# Patient Record
Sex: Female | Born: 1975 | Race: White | Hispanic: No | Marital: Married | Smoking: Never smoker
Health system: Southern US, Community
[De-identification: ages and names within clinical notes are randomized; demographics above are authoritative.]

## PROBLEM LIST (undated history)

## (undated) DIAGNOSIS — O99892 Other specified diseases and conditions complicating childbirth: Secondary | ICD-10-CM

---

## 2000-02-26 ENCOUNTER — Other Ambulatory Visit: Admission: RE | Admit: 2000-02-26 | Discharge: 2000-02-26 | Payer: Self-pay | Admitting: *Deleted

## 2000-05-28 ENCOUNTER — Encounter: Admission: RE | Admit: 2000-05-28 | Discharge: 2000-05-28 | Payer: Self-pay | Admitting: *Deleted

## 2000-05-28 ENCOUNTER — Encounter: Payer: Self-pay | Admitting: *Deleted

## 2001-03-03 ENCOUNTER — Other Ambulatory Visit: Admission: RE | Admit: 2001-03-03 | Discharge: 2001-03-03 | Payer: Self-pay | Admitting: Gynecology

## 2002-03-19 ENCOUNTER — Other Ambulatory Visit: Admission: RE | Admit: 2002-03-19 | Discharge: 2002-03-19 | Payer: Self-pay | Admitting: Gynecology

## 2002-10-14 ENCOUNTER — Other Ambulatory Visit: Admission: RE | Admit: 2002-10-14 | Discharge: 2002-10-14 | Payer: Self-pay | Admitting: Gynecology

## 2003-04-28 ENCOUNTER — Inpatient Hospital Stay (HOSPITAL_COMMUNITY): Admission: AD | Admit: 2003-04-28 | Discharge: 2003-04-28 | Payer: Self-pay | Admitting: Gynecology

## 2003-05-09 ENCOUNTER — Inpatient Hospital Stay (HOSPITAL_COMMUNITY): Admission: AD | Admit: 2003-05-09 | Discharge: 2003-05-13 | Payer: Self-pay | Admitting: Gynecology

## 2003-05-10 ENCOUNTER — Encounter (INDEPENDENT_AMBULATORY_CARE_PROVIDER_SITE_OTHER): Payer: Self-pay

## 2003-06-28 ENCOUNTER — Other Ambulatory Visit: Admission: RE | Admit: 2003-06-28 | Discharge: 2003-06-28 | Payer: Self-pay | Admitting: Gynecology

## 2004-07-31 ENCOUNTER — Other Ambulatory Visit: Admission: RE | Admit: 2004-07-31 | Discharge: 2004-07-31 | Payer: Self-pay | Admitting: Gynecology

## 2005-01-31 ENCOUNTER — Ambulatory Visit: Payer: Self-pay | Admitting: Oncology

## 2005-02-13 ENCOUNTER — Encounter: Admission: RE | Admit: 2005-02-13 | Discharge: 2005-02-13 | Payer: Self-pay | Admitting: Obstetrics and Gynecology

## 2005-03-27 ENCOUNTER — Ambulatory Visit: Payer: Self-pay | Admitting: Oncology

## 2005-10-10 ENCOUNTER — Other Ambulatory Visit: Admission: RE | Admit: 2005-10-10 | Discharge: 2005-10-10 | Payer: Self-pay | Admitting: Obstetrics and Gynecology

## 2006-04-09 ENCOUNTER — Ambulatory Visit: Payer: Self-pay | Admitting: Oncology

## 2006-04-15 LAB — CARDIOLIPIN ANTIBODIES, IGG, IGM, IGA: Anticardiolipin IgA: <7 [APL'U] (ref <13)

## 2006-04-15 LAB — LUPUS ANTICOAGULANT PANEL
DRVVT: 44.6 secs — ABNORMAL HIGH (ref 31.9–44.2)
PTT Lupus Anticoagulant: 57 secs — ABNORMAL HIGH (ref 36.3–48.8)
PTTLA 4:1 Mix: 45.1 secs (ref 36.3–48.8)

## 2006-12-11 ENCOUNTER — Inpatient Hospital Stay (HOSPITAL_COMMUNITY): Admission: AD | Admit: 2006-12-11 | Discharge: 2006-12-14 | Payer: Self-pay | Admitting: Obstetrics and Gynecology

## 2006-12-12 ENCOUNTER — Encounter (INDEPENDENT_AMBULATORY_CARE_PROVIDER_SITE_OTHER): Payer: Self-pay | Admitting: Obstetrics and Gynecology

## 2009-04-27 ENCOUNTER — Inpatient Hospital Stay (HOSPITAL_COMMUNITY): Admission: AD | Admit: 2009-04-27 | Discharge: 2009-04-29 | Payer: Self-pay | Admitting: Obstetrics and Gynecology

## 2009-06-08 ENCOUNTER — Inpatient Hospital Stay (HOSPITAL_COMMUNITY): Admission: RE | Admit: 2009-06-08 | Discharge: 2009-06-12 | Payer: Self-pay | Admitting: Obstetrics and Gynecology

## 2010-09-24 LAB — ABO/RH: ABO/RH(D): A POS

## 2010-09-24 LAB — CBC
HCT: 32.7 % — ABNORMAL LOW (ref 36.0–46.0)
HCT: 36 % (ref 36.0–46.0)
Hemoglobin: 11.2 g/dL — ABNORMAL LOW (ref 12.0–15.0)
Hemoglobin: 12.3 g/dL (ref 12.0–15.0)
MCHC: 34 g/dL (ref 30.0–36.0)
MCV: 91.1 fL (ref 78.0–100.0)
MCV: 91.6 fL (ref 78.0–100.0)
Platelets: 229 10*3/uL (ref 150–400)
RBC: 3.95 MIL/uL (ref 3.87–5.11)
RDW: 13.3 % (ref 11.5–15.5)
WBC: 11.6 10*3/uL — ABNORMAL HIGH (ref 4.0–10.5)
WBC: 11.7 10*3/uL — ABNORMAL HIGH (ref 4.0–10.5)

## 2010-09-24 LAB — TYPE AND SCREEN
ABO/RH(D): A POS
Antibody Screen: NEGATIVE

## 2010-09-24 LAB — RPR: RPR Ser Ql: NONREACTIVE

## 2010-09-26 LAB — CBC
HCT: 32.2 % — ABNORMAL LOW (ref 36.0–46.0)
Hemoglobin: 11.1 g/dL — ABNORMAL LOW (ref 12.0–15.0)
MCHC: 34.3 g/dL (ref 30.0–36.0)
RDW: 12.7 % (ref 11.5–15.5)

## 2010-11-06 NOTE — Op Note (Signed)
[name], [name]                ACCOUNT NO.:  [id number]   MEDICAL RECORD NO.:  [id number]          PATIENT TYPE:  INP   LOCATION:  9171                          FACILITY:  [facility]   PHYSICIAN:  Michelle L. Grewal, M.D.DATE OF BIRTH:  Dec 07, 1975   DATE OF PROCEDURE:  12/12/2006  DATE OF DISCHARGE:                               OPERATIVE REPORT   PREOPERATIVE DIAGNOSES:  1. Intrauterine pregnancy at term.  2. Failed vaginal birth after Cesarean section.  3. Failure to progress.   POSTOPERATIVE DIAGNOSES:  1. Intrauterine pregnancy at term.  2. Failed vaginal birth after Cesarean section.  3. Failure to progress.   PROCEDURE:  Repeat low transverse cesarean section and extensive lysis  of adhesions.   SURGEON:  Dr. Vincente Poli.   ANESTHESIA:  Is epidural.   SPECIMENS:  Female infant.  Apgars of 9 at one minute and 9 at five  minutes.   ESTIMATED BLOOD LOSS:  500 mL.   DRAINS:  Foley, draining clear urine.   PATHOLOGY:  None.   COMPLICATIONS:  None.   PROCEDURE:  Patient taken to the operating room.  Her epidural was dosed  and found to be adequate.  She was then prepped and draped.  A low  transverse incision was made, carried down to the fascia, the fascia  scored in the midline and extended laterally.  The rectus muscles were  separated in the midline.  Upon entry into the peritoneal cavity, the  uterus was noted to be densely adherent to the anterior abdominal wall.  I was able to find a window and was able to take down some of the  adhesions.  We were then able to develop a bladder flap.  The lower  uterine segment was extremely thin and bulging.  A low transverse  incision was made in the uterus.  Uterus was entered using a hemostat.  Amniotic fluid was clear.  The baby was in direct OP position and was  delivered without any difficulty.  The head was still quite high.  The  cord was clamped and cut.  The baby was a female infant with Apgars of 9  at one minute and 9 at  five minutes.  The placenta was manually removed  and noted to be normal intact with three-vessel cord.  At this point,  antibiotics and Pitocin were given.  We then closed the uterine incision  in 1 layer using 0 chromic in continuous running locked stitch.  Hemostasis was excellent.  I was able to take down some of the  adhesions, but some of the adhesions were densely adherent to the fundus  of the uterus, and I was concerned that if adhesions were taken down,  then there might be extensive bleeding given that the vascular nature of  the uterus.  I then reapproximated the rectus muscles together using 0  Vicryl and then closed the fascia using 0 Vicryl in a  running stitch starting at each corner and meeting in the midline.  After irrigation of the subcutaneous layer, the skin was closed with 3-0  Vicryl subcuticular stitch, and the  skin was sealed with Dermabond skin  adhesive.  All sponge, lap and instrument counts were correct x2.  The  patient went to the recovery room in stable condition.      Michelle L. Vincente Poli, M.D.  Electronically Signed     MLG/MEDQ  D:  12/12/2006  T:  12/13/2006  Job:  [id number]

## 2010-11-09 NOTE — H&P (Signed)
NAMEARIELL, GUNNELS                            ACCOUNT NO.:  [id number]   MEDICAL RECORD NO.:  [id number]                   PATIENT TYPE:   LOCATION:                                       [facility]ILITY:  [facility]   PHYSICIAN:  Timothy P. Fontaine, M.D.           DATE OF BIRTH:  1976/06/08   DATE OF ADMISSION:  05/09/2003  DATE OF DISCHARGE:                                HISTORY & PHYSICAL   CHIEF COMPLAINT:  1. Pregnancy at 38-1/2 weeks.  2. Antiphospholipid syndrome on anticoagulation.  3. Lovenox 40 mg daily.  4. Family history of hemophilia.  5. Large for gestational age baby.   HISTORY OF PRESENT ILLNESS:  A 73 year old, G1, P0 female with a history of  antiphospholipid syndrome.  Begun on Lovenox 40 mg per day.  The patient is  admitted now for induction due to a history of being on Lovenox to allow  discontinuation and regional anesthesia.  The obstetrical history is also  significant for a family history of hemophilia, fetal status unknown, with  plans on holding on circumcision after birth to allow fetal investigation  first.  The prenatal course has otherwise been uncomplicated.  She has been  undergoing antepartum testing with reassuring NSTs and amniotic fluid index.  For the remainder of her history, see her [place] on exam.   PHYSICAL EXAMINATION:  HEENT:  Normal.  LUNGS:  Clear.  CARDIAC:  Regular rhythm.  No murmurs, rubs, or gallops.  ABDOMEN:  Gravid.  Vertex fetus appropriate for term.  PELVIC:  The cervix is closed and long posterior.   ASSESSMENT AND PLAN:  A 35 year old G1 with antiphospholipid syndrome on  Lovenox for serial induction to allow discontinuation of Lovenox and  regional anesthesia.  Will plan on Cervidil the evening of admission and  Pitocin the following day.  Will hold Lovenox 24 hours prior to admission.                                               Timothy P. Audie Box, M.D.    TPF/MEDQ  D:  05/09/2003  T:  05/09/2003  Job:  [id number]

## 2010-11-09 NOTE — Discharge Summary (Signed)
[name], [name]                            ACCOUNT NO.:  [id number]   MEDICAL RECORD NO.:  [id number]                   PATIENT TYPE:  INP   LOCATION:  9124                                 [facility]ACILITY:  [facility]   PHYSICIAN:  Ivor Costa. Farrel Gobble, M.D.              DATE OF BIRTH:  03-Jan-1976   DATE OF ADMISSION:  05/09/2003  DATE OF DISCHARGE:  05/13/2003                                 DISCHARGE SUMMARY   DISCHARGE DIAGNOSES:  1. Intrauterine pregnancy 38-1/2 weeks, delivered.  2. Antiphospholipid syndrome on anticoagulation, Lovenox.  3. Family history of hemophilia.  4. Suspected large for gestational age baby.  5. Chorioamnionitis.  6. Status post primary low transverse cesarean section by Timothy P.     Fontaine, M.D. on May 10, 2003.   HISTORY:  This is a 35 year old female gravida 1, para 0 with an EDC of  May 20, 2003.  Prenatal course was complicated by antiphospholipid  syndrome.  Therefore, was on Lovenox 40 mg daily.  Also, suspected  macrosomia.  The patient has a family history of hemophilia.  Fetal status  unknown.  Therefore, plans were to hold on circumcision until after birth  until fetal investigation was done.   HOSPITAL COURSE:  On May 09, 2003 patient is admitted at 38-1/2 weeks  with __________ antiphospholipid syndrome, suspected large for gestational  age baby.  The patient was begun on Pitocin.  Artificial rupture of  membranes performed.  However, patient subsequently developed  chorioamnionitis.  Therefore, underwent a primary low transverse cesarean  section by Timothy P. Fontaine, M.D.  Underwent delivery of a female, Apgars  of 8 and 9, weight of 8 pounds 5 ounces.  There were no complications.  Specimens were taken for routine cord blood __________, PT/PTT for factor  VIII, factor IX cord blood studies as well.  Postoperative day one patient  did have a Tmax of 103 that a.m., however, was afebrile on May 12, 2003.  The patient was  afebrile.  Vital signs stable.  Doing well.  May 13, 2003 patient had been afebrile since that first elevation and  antibiotics were discontinued 8 p.m. of May 12, 2003.  The patient was  thought in satisfactory condition for discharge and was discharged home and  given [facility] postpartum instruction.   ACCESSORY CLINICAL FINDINGS:  Laboratories:  The patient is A+.  Rubella  immune.  On May 11, 2003 hemoglobin 10.1.   DISPOSITION:  The patient is discharged to home.  She was to be on  postpartum baby aspirin.  She was to return to triage for circumcision which  was cleared by pediatricians.  Given prescription for Tylox p.r.n. pain #30.     Susa Loffler, P.A.                    Ivor Costa. Farrel Gobble, M.D.    TSG/MEDQ  D:  06/06/2003  T:  06/06/2003  Job:  [id number]

## 2010-11-09 NOTE — Discharge Summary (Signed)
[name], [name]                ACCOUNT NO.:  [id number]   MEDICAL RECORD NO.:  [id number]          PATIENT TYPE:  INP   LOCATION:  9137                          [facility]ACILITY:  [facility]   PHYSICIAN:  Michelle L. Grewal, M.D.DATE OF BIRTH:  Oct 25, 1975   DATE OF ADMISSION:  12/11/2006  DATE OF DISCHARGE:  12/14/2006                               DISCHARGE SUMMARY   ADMISSION DIAGNOSES:  1. Intrauterine pregnancy at 39-6/7 weeks estimated gestational age.  2. Previous cesarean section, desires vaginal birth after cesarean      section.   DISCHARGE DIAGNOSIS:  Status post low transverse cesarean section  secondary to failure to progress with a viable female infant.   PROCEDURE:  Repeat low transverse cesarean section.   REASON FOR ADMISSION:  Please see written H&P.   HOSPITAL COURSE:  The patient is 35 year old gravida 2, para 1-0-0-1  that was presented to [facility] for induction of labor  secondary to desires VBAC delivery.  The patient had had a previous  cesarean section.  On admission vital signs were stable.  Fetal heart  tones were reassuring.  The patient was administered Cervidil with plan  to augment her labor with Pitocin on the following morning.  On the  following morning cervix was dilated to 1 cm, vertex at minus two  station, 90% effaced.  Artificial rupture of membranes was performed  revealing clear fluid.  Pitocin was started and epidural was given for  the patient's comfort.  Later that evening the patient had been in good  labor pattern all day and uterine pressure catheter had been placed  which had revealed adequate labor.  Cervix was reexamined and noted to  be 6 cm dilated with caput.  Further change had been observed to the  last 4 hours.  Decision was made to proceed with a repeat low transverse  cesarean section.  The patient was then transferred to operating room  where epidural was dosed to an adequate surgical level.  Low transverse  incision  was made with delivery of a viable female infant weighing 8  pounds 6 ounces Apgars of 9 at 1 and 9 at 5 minutes.  The patient  tolerated procedure well and taken to the recovery room in stable  condition.  On postoperative day #1 the patient was without complaint.  Vital signs were stable.  She is afebrile.  Abdomen soft.  Uterus was  nontender.  Incision was clean, dry and intact. Laboratory findings  revealed hemoglobin of 10.6, platelet count of 205,000, WBC count of  13.7. On postoperative day #2 the patient did desire early discharge.  Vital signs were stable.  She is afebrile.  Abdomen soft.  Fundus firm  and nontender.  Incision was clean, dry and intact.  Discharge  instructions were reviewed and the patient was later discharged home.   CONDITION ON DISCHARGE:  Stable, diet regular as tolerated.   ACTIVITY:  No heavy lifting, no driving x2 weeks, no vaginal entry.   FOLLOW UP:  Patient follow up in the office in 1 week for an incision  check.  She is to call for temperature greater than 100 degrees,  persistent nausea, vomiting, heavy vaginal bleeding and/or redness or  drainage from incisional site.   DISCHARGE MEDICATIONS:  1. Darvocet N 100 #30 one every 4-6 hours.  2. Motrin 600 mg every 6 hours.  3. Prenatal vitamins one p.o. daily.  4. Colace one p.o. daily p.r.n.      Julio Sicks, N.P.      Stann Mainland. Vincente Poli, M.D.  Electronically Signed    CC/MEDQ  D:  01/13/2007  T:  01/13/2007  Job:  [id number]

## 2010-11-09 NOTE — Op Note (Signed)
[name], [name]                            ACCOUNT NO.:  [id number]   MEDICAL RECORD NO.:  [id number]                   PATIENT TYPE:  INP   LOCATION:  9124                                 [facility]   PHYSICIAN:  Timothy P. Fontaine, M.D.           DATE OF BIRTH:  [birth year]   DATE OF PROCEDURE:  05/10/2003  DATE OF DISCHARGE:                                 OPERATIVE REPORT   PREOPERATIVE DIAGNOSES:  1. Failure to progress.  2. Term pregnancy.  3. Antiphospholipid syndrome.  4. Chorioamnionitis.   POSTOPERATIVE DIAGNOSES:  1. Failure to progress.  2. Term pregnancy.  3. Antiphospholipid syndrome.  4. Chorioamnionitis.   PROCEDURE:  Primary low transverse cervical cesarean section.   SURGEON:  Timothy P. Fontaine, M.D.   ASSISTANT:  Scrub technician.   ANESTHESIA:  Epidural.   ESTIMATED BLOOD LOSS:  500 mL.   COMPLICATIONS:  None.   SPECIMENS:  1. Placenta.  2. Routine cord blood and studies.  3. PT/PTT factor VIII, factor IX cord blood studies.   FINDINGS:  Normal female infant, Apgars 8 and 9, nuchal cord x2 noted.  Weight  and time pending.  Pelvic anatomy noted to be normal.  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   DESCRIPTION OF PROCEDURE:  The patient was taken to the operating room,  underwent epidural dosing of her catheter.  Placed in left tilt supine  position, received an abdominal preparation with Betadine solution, draped  in usual fashion, having had a Foley catheter previously placed.  After  assuring adequate anesthesia, the abdomen was sharply entered through a  Pfannenstiel incision achieving adequate hemostasis at all levels.  The  bladder flap was sharply and bluntly developed without difficulty and the  uterus was sharply entered in the lower uterine segment and bluntly extended  laterally.  The membranes were ruptured.  The fluid noted to be clear.  The  infant's head delivered through  the incision.  Nares and mouth suctioned.  Double nuchal cord reduced.  The rest of the infant delivered.  The cord  doubly clamped and cut and the infant handed to pediatrics in attendance.  Samples of cord blood were obtained for routine studies as well as for  coagulation studies.  The placenta was spontaneously extracted and noted to  be intact.  The patient received 1 g of Ancef prophylaxis at this time.  Uterus was exteriorized.  The endometrial cavity explored with a sponge to  remove all placental and membrane fragments.  The uterine incision was  closed in one layer using 0 Vicryl suture in a running interlocking stitch.  The uterus was then returned to the abdomen which was copiously irrigated.  Adequate hemostasis visualized.  The anterior fascia was reapproximated  using 0 Vicryl suture in a running stitch.  The subcutaneous tissues were  irrigated.  Adequate hemostasis achieved with electrocautery.  The skin  reapproximated using a running subcuticular 4-0 Vicryl stitch.  Steri-Strips  with Benzoin applied.  Pressure dressing applied.  The patient was taken to  the recovery room in good condition having tolerated the procedure well.                                               Timothy P. Audie Box, M.D.    TPF/MEDQ  D:  05/10/2003  T:  05/11/2003  Job:  [id number]

## 2011-04-10 LAB — CBC
HCT: 35.7 — ABNORMAL LOW
Hemoglobin: 10.6 — ABNORMAL LOW
MCHC: 34.5
MCHC: 34.7
MCV: 87.8
Platelets: 251
RDW: 13.7
RDW: 14.1 — ABNORMAL HIGH
WBC: 8.7

## 2011-06-14 ENCOUNTER — Emergency Department (HOSPITAL_COMMUNITY)
Admission: EM | Admit: 2011-06-14 | Discharge: 2011-06-14 | Disposition: A | Discharge status: Home / Self Care | Payer: Self-pay | Source: Home / Self Care

## 2011-06-14 DIAGNOSIS — J019 Acute sinusitis, unspecified: Secondary | ICD-10-CM

## 2011-06-14 DIAGNOSIS — H109 Unspecified conjunctivitis: Secondary | ICD-10-CM

## 2011-06-14 HISTORY — DX: Other specified diseases and conditions complicating childbirth: O99.892

## 2011-06-14 MED ORDER — CIPROFLOXACIN HCL 0.3 % OP SOLN: 2.0000 [drp] | Freq: Three times a day (TID) | OPHTHALMIC | Status: AC

## 2011-06-14 MED ORDER — AMOXICILLIN-POT CLAVULANATE 875-125 MG PO TABS: 1.0000 | ORAL_TABLET | Freq: Two times a day (BID) | ORAL | Status: AC

## 2011-06-14 NOTE — ED Provider Notes (Signed)
History     CSN: [id number]  Arrival date & time 06/14/11  1927   None     Chief Complaint  Patient presents with  . Nasal Congestion    (Consider location/radiation/quality/duration/timing/severity/associated sxs/prior treatment) HPI Comments: Pt c/o head congestion, sinus pressure and green nasal mucus x 1 month. Also has a mild cough, but no chest congestion. No ear pain or sore throat. Taking Sudafed and Motrin with some symptom relief. Awoke this morning with crusting of bilat eyes. No mucus or irritation since awoke. Is concerned may be getting pink eye as son recently had pink eye infection.   No past medical history on file.  No past surgical history on file.  No family history on file.  History  Substance Use Topics  . Smoking status: Not on file  . Smokeless tobacco: Not on file  . Alcohol Use: Not on file    OB History    No data available      Review of Systems  Constitutional: Negative for fever and chills.  HENT: Positive for congestion, rhinorrhea, postnasal drip and sinus pressure. Negative for ear pain and sore throat.   Eyes: Negative for pain, discharge, redness and itching.  Respiratory: Positive for cough. Negative for shortness of breath.   Cardiovascular: Negative for chest pain.    Allergies  Review of patient's allergies indicates not on file.  Home Medications  No current outpatient prescriptions on file.  BP 114/62  Pulse 96  Temp(Src) 98.2 F (36.8 C) (Oral)  Resp 20  SpO2 96%  Physical Exam  Nursing note and vitals reviewed. Constitutional: She appears well-developed and well-nourished. No distress.  HENT:  Head: Normocephalic and atraumatic.  Right Ear: Tympanic membrane, external ear and ear canal normal.  Left Ear: Tympanic membrane, external ear and ear canal normal.  Nose: Mucosal edema and rhinorrhea (thick yellow/green mucus Lt nostril) present. Right sinus exhibits maxillary sinus tenderness and frontal sinus  tenderness. Left sinus exhibits maxillary sinus tenderness and frontal sinus tenderness.  Mouth/Throat: Uvula is midline, oropharynx is clear and moist and mucous membranes are normal. No oropharyngeal exudate, posterior oropharyngeal edema or posterior oropharyngeal erythema.  Eyes: Conjunctivae are normal. Pupils are equal, round, and reactive to light.  Neck: Neck supple.  Cardiovascular: Normal rate, regular rhythm and normal heart sounds.   Pulmonary/Chest: Effort normal and breath sounds normal. No respiratory distress.  Lymphadenopathy:    She has no cervical adenopathy.  Neurological: She is alert.  Skin: Skin is warm and dry.  Psychiatric: She has a normal mood and affect.    ED Course  Procedures (including critical care time)  Labs Reviewed - No data to display No results found.   No diagnosis found.    MDM  Sinus congestion symptoms x 4 weeks. Possible early conjunctivitis with normal eye exam.        Melody Comas, PA 06/14/11 2027

## 2011-06-14 NOTE — ED Notes (Signed)
C/o eyes matted, congestion, HA

## 2011-06-15 NOTE — ED Provider Notes (Signed)
Medical screening examination/treatment/procedure(s) were performed by 3rd year family medicine resident practitioner and as supervising physician I was immediately available for consultation/collaboration.   [facility]; MD   Sharin Grave, MD 06/15/11 [phone]

## 2012-04-01 ENCOUNTER — Emergency Department (HOSPITAL_COMMUNITY)
Admission: EM | Admit: 2012-04-01 | Discharge: 2012-04-01 | Disposition: A | Discharge status: Home / Self Care | Payer: 59 | Source: Home / Self Care

## 2012-04-01 ENCOUNTER — Encounter (HOSPITAL_COMMUNITY): Payer: Self-pay | Admitting: *Deleted

## 2012-04-01 DIAGNOSIS — L03019 Cellulitis of unspecified finger: Secondary | ICD-10-CM

## 2012-04-01 DIAGNOSIS — L03011 Cellulitis of right finger: Secondary | ICD-10-CM

## 2012-04-01 MED ORDER — CEPHALEXIN 500 MG PO CAPS: 500.0000 mg | ORAL_CAPSULE | Freq: Four times a day (QID) | ORAL | Status: DC

## 2012-04-01 NOTE — ED Provider Notes (Signed)
History     CSN: [id number]  Arrival date & time 04/01/12  1936   None     No chief complaint on file.   (Consider location/radiation/quality/duration/timing/severity/associated sxs/prior treatment) HPI Comments: 36 year old female with complaints of right fifth finger pain for 10 days. She is developed erythema distal to the lunula with mild swelling adjacent to the nail. She is uncertain as to what may have caused the sons. She denies any known trauma. She has been soaking it in warm water and has on several occasions seen some purulent drainage.   Past Medical History  Diagnosis Date  . Delivery By Emergency Caesarean Section     No past surgical history on file.  No family history on file.  History  Substance Use Topics  . Smoking status: Not on file  . Smokeless tobacco: Not on file  . Alcohol Use:     OB History    Grav Para Term Preterm Abortions TAB SAB Ect Mult Living                  Review of Systems  Constitutional: Negative for fever, chills and activity change.  HENT: Negative.   Respiratory: Negative.   Cardiovascular: Negative.   Musculoskeletal: Negative for joint swelling and arthralgias.  Skin: Negative for color change, pallor and rash.  Neurological: Negative.     Allergies  Sulfa antibiotics  Home Medications   Current Outpatient Rx  Name Route Sig Dispense Refill  . BUPROPION HCL ER (XL) 300 MG PO TB24 Oral Take 300 mg by mouth daily.      . CEPHALEXIN 500 MG PO CAPS Oral Take 1 capsule (500 mg total) by mouth 4 (four) times daily. 32 capsule 0  . CETIRIZINE HCL 10 MG PO TABS Oral Take 10 mg by mouth daily.      . IBUPROFEN 600 MG PO TABS Oral Take 600 mg by mouth every 6 (six) hours as needed.        BP 100/48  Pulse 66  Temp 98.2 F (36.8 C) (Oral)  Resp 16  SpO2 100%  Physical Exam  Constitutional: She is oriented to person, place, and time. She appears well-developed and well-nourished. No distress.  Pulmonary/Chest:  Effort normal.  Musculoskeletal: Normal range of motion. She exhibits tenderness. She exhibits no edema.  Neurological: She is alert and oriented to person, place, and time. No cranial nerve deficit.  Skin: Skin is warm and dry. No rash noted.       Puffy erythema just proximal to the lunula. No fluctuance or appearance of purulence within the area of erythema. There is no felon or pulp involvement.  Psychiatric: She has a normal mood and affect.    ED Course  Procedures (including critical care time)  Labs Reviewed - No data to display No results found.   1. Paronychia of fifth finger, right       MDM  Keflex 500 mg 4 times a day for 8 days. Continue soaking in warm water. In the sons accumulation of purulence that has not drained spontaneously spreading of erythema or other worsening return to the urgent care.        Hayden Rasmussen, NP 04/01/12 2106

## 2012-04-01 NOTE — ED Provider Notes (Signed)
Medical screening examination/treatment/procedure(s) were performed by non-physician practitioner and as supervising physician I was immediately available for consultation/collaboration.  Shandy Vi, M.D.   Angalena Cousineau C Zay Yeargan, MD 04/01/12 2251 

## 2012-04-01 NOTE — ED Notes (Signed)
Paronychia to R small finger onset 9/30.  Has been using warm compresses and bacitracin oint.

## 2013-09-01 ENCOUNTER — Emergency Department (HOSPITAL_COMMUNITY)
Admission: EM | Admit: 2013-09-01 | Discharge: 2013-09-01 | Disposition: A | Discharge status: Home / Self Care | Payer: 59 | Source: Home / Self Care | Attending: Family Medicine | Admitting: Family Medicine

## 2013-09-01 ENCOUNTER — Encounter (HOSPITAL_COMMUNITY): Payer: Self-pay | Admitting: Emergency Medicine

## 2013-09-01 DIAGNOSIS — R7402 Elevation of levels of lactic acid dehydrogenase (LDH): Secondary | ICD-10-CM

## 2013-09-01 DIAGNOSIS — R109 Unspecified abdominal pain: Secondary | ICD-10-CM

## 2013-09-01 DIAGNOSIS — R740 Nonspecific elevation of levels of transaminase and lactic acid dehydrogenase [LDH]: Secondary | ICD-10-CM

## 2013-09-01 DIAGNOSIS — R7401 Elevation of levels of liver transaminase levels: Secondary | ICD-10-CM

## 2013-09-01 LAB — COMPREHENSIVE METABOLIC PANEL
ALK PHOS: 52 U/L (ref 39–117)
ALT: 66 U/L — ABNORMAL HIGH (ref 0–35)
AST: 41 U/L — ABNORMAL HIGH (ref 0–37)
Albumin: 4.1 g/dL (ref 3.5–5.2)
BILIRUBIN TOTAL: 0.3 mg/dL (ref 0.3–1.2)
BUN: 14 mg/dL (ref 6–23)
CHLORIDE: 100 meq/L (ref 96–112)
CO2: 27 mEq/L (ref 19–32)
CREATININE: 0.9 mg/dL (ref 0.50–1.10)
Calcium: 9.4 mg/dL (ref 8.4–10.5)
GFR, EST NON AFRICAN AMERICAN: 81 mL/min — AB (ref >90)
GLUCOSE: 85 mg/dL (ref 70–99)
POTASSIUM: 4.3 meq/L (ref 3.7–5.3)
Sodium: 141 mEq/L (ref 137–147)
Total Protein: 7.8 g/dL (ref 6.0–8.3)

## 2013-09-01 LAB — LIPASE, BLOOD: LIPASE: 36 U/L (ref 11–59)

## 2013-09-01 LAB — CBC WITH DIFFERENTIAL/PLATELET
BASOS PCT: 0 % (ref 0–1)
Basophils Absolute: 0 10*3/uL (ref 0.0–0.1)
Eosinophils Absolute: 0.3 10*3/uL (ref 0.0–0.7)
Eosinophils Relative: 3 % (ref 0–5)
HEMATOCRIT: 40.2 % (ref 36.0–46.0)
HEMOGLOBIN: 14.1 g/dL (ref 12.0–15.0)
LYMPHS ABS: 3.1 10*3/uL (ref 0.7–4.0)
Lymphocytes Relative: 39 % (ref 12–46)
MCH: 31.5 pg (ref 26.0–34.0)
MCHC: 35.1 g/dL (ref 30.0–36.0)
MCV: 89.7 fL (ref 78.0–100.0)
MONO ABS: 0.6 10*3/uL (ref 0.1–1.0)
MONOS PCT: 8 % (ref 3–12)
NEUTROS ABS: 4 10*3/uL (ref 1.7–7.7)
NEUTROS PCT: 50 % (ref 43–77)
Platelets: 280 10*3/uL (ref 150–400)
RBC: 4.48 MIL/uL (ref 3.87–5.11)
RDW: 12.2 % (ref 11.5–15.5)
WBC: 8 10*3/uL (ref 4.0–10.5)

## 2013-09-01 MED ORDER — GI COCKTAIL ~~LOC~~
30.0000 mL | Freq: Once | ORAL | Status: AC
  Administered 2013-09-01: 30 mL via ORAL

## 2013-09-01 MED ORDER — OMEPRAZOLE 40 MG PO CPDR: 40.0000 mg | DELAYED_RELEASE_CAPSULE | Freq: Every day | ORAL | Status: AC

## 2013-09-01 MED ORDER — SUCRALFATE 1 GM/10ML PO SUSP
1.0000 g | Freq: Three times a day (TID) | ORAL | Status: AC
Start: 2013-09-01 — End: ?

## 2013-09-01 MED ORDER — GI COCKTAIL ~~LOC~~
ORAL | Status: AC
  Filled 2013-09-01: qty 30

## 2013-09-01 NOTE — ED Notes (Signed)
Patient complains of abdominal pain in upper right quadrant; describes pain as dull and aching; states pain is worse when lying down and reaching forward; states pain started Sunday; with pain worsening in past two days.

## 2013-09-01 NOTE — Discharge Instructions (Signed)

## 2013-09-01 NOTE — ED Provider Notes (Signed)
CSN: [id number]     Arrival date & time 09/01/13  1926 History   First MD Initiated Contact with Patient 09/01/13 2014     No chief complaint on file.  (Consider location/radiation/quality/duration/timing/severity/associated sxs/prior Treatment) HPI Comments: 38 year old female presents for evaluation of abdominal pain. Since Sunday 4 days ago, she has intermittent right upper quadrant pain. The pain seems to be worse when she stretches out or lays back and is somewhat relieved by sitting upright. The pain comes and goes, and is very severe, 10 out of 10 in severity when it comes on. She is not having any pain at this time. The pain is not affected by eating or taking a deep breath. It is associated with some nausea. She has not had any vomiting. The pain does not radiate. She has no diarrhea. She has no history of similar symptoms. She has a possible history of antiphospholipid antibody syndrome, however she was seen by hematology and they told her they don't think she has it. She denies chest pain or shortness of breath. She has history of 3 C-sections, no other abdominal surgeries. No fever   Past Medical History  Diagnosis Date  . Delivery by emergency caesarean section    Past Surgical History  Procedure Laterality Date  . Cesarean section       x 3 '04, '08, '10   No family history on file. History  Substance Use Topics  . Smoking status: Never Smoker   . Smokeless tobacco: Not on file  . Alcohol Use: Yes     Comment: occasional   OB History   Grav Para Term Preterm Abortions TAB SAB Ect Mult Living                 Review of Systems  Constitutional: Negative for fever and chills.  Eyes: Negative for visual disturbance.  Respiratory: Negative for cough and shortness of breath.   Cardiovascular: Negative for chest pain, palpitations and leg swelling.  Gastrointestinal: Positive for nausea and abdominal pain. Negative for vomiting.  Endocrine: Negative for polydipsia and  polyuria.  Genitourinary: Negative for dysuria, urgency and frequency.  Musculoskeletal: Negative for arthralgias and myalgias.  Skin: Negative for rash.  Neurological: Negative for dizziness, weakness and light-headedness.    Allergies  Sulfa antibiotics  Home Medications   Current Outpatient Rx  Name  Route  Sig  Dispense  Refill  . buPROPion (WELLBUTRIN XL) 300 MG 24 hr tablet   Oral   Take 300 mg by mouth daily.           . cetirizine (ZYRTEC) 10 MG tablet   Oral   Take 10 mg by mouth daily.           . fluticasone (FLONASE) 50 MCG/ACT nasal spray   Each Nare   Place into both nostrils daily.         Marland Kitchen ibuprofen (ADVIL,MOTRIN) 600 MG tablet   Oral   Take 600 mg by mouth every 6 (six) hours as needed.           . Prenatal Vit-Fe Fumarate-FA (PRENATAL MULTIVITAMIN) TABS   Oral   Take 1 tablet by mouth daily.         . cephALEXin (KEFLEX) 500 MG capsule   Oral   Take 1 capsule (500 mg total) by mouth 4 (four) times daily.   32 capsule   0   . omeprazole (PRILOSEC) 40 MG capsule   Oral   Take 1 capsule (40 mg  total) by mouth daily.   30 capsule   0   . sucralfate (CARAFATE) 1 GM/10ML suspension   Oral   Take 10 mLs (1 g total) by mouth 4 (four) times daily -  with meals and at bedtime.   420 mL   0    BP 105/52  Pulse 67  Temp(Src) 98.4 F (36.9 C) (Oral)  Resp 20  SpO2 100%  LMP 08/24/2013 Physical Exam  Nursing note and vitals reviewed. Constitutional: She is oriented to person, place, and time. Vital signs are normal. She appears well-developed and well-nourished. No distress.  HENT:  Head: Normocephalic and atraumatic.  Eyes: Conjunctivae are normal. Right eye exhibits no discharge. Left eye exhibits no discharge.  Neck: Normal range of motion. Neck supple. No JVD present.  Cardiovascular: Normal rate, regular rhythm and normal heart sounds.  Exam reveals no gallop and no friction rub.   No murmur heard. Pulmonary/Chest: Effort normal  and breath sounds normal. No respiratory distress. She has no wheezes. She has no rales. She exhibits no tenderness.  Abdominal: Soft. Normal appearance and bowel sounds are normal. She exhibits no distension. There is no hepatosplenomegaly. There is tenderness (mild to moderate) in the right upper quadrant and epigastric area. There is no rigidity, no rebound, no guarding, no CVA tenderness, no tenderness at McBurney's point and negative Murphy's sign. No hernia.  Musculoskeletal: Normal range of motion.  Neurological: She is alert and oriented to person, place, and time. She has normal strength. She exhibits normal muscle tone. Coordination normal.  Skin: Skin is warm and dry. No rash noted. She is not diaphoretic.  Psychiatric: She has a normal mood and affect. Judgment and thought content normal.    ED Course  Procedures (including critical care time) Labs Review Labs Reviewed  COMPREHENSIVE METABOLIC PANEL - Abnormal; Notable for the following:    AST 41 (*)    ALT 66 (*)    GFR calc non Af Amer 81 (*)    All other components within normal limits  CBC WITH DIFFERENTIAL  LIPASE, BLOOD   Imaging Review No results found.  Some relief with GI cocktail, not total relief  MDM   1. Abdominal pain   2. Transaminitis    CBC came back, patient does not have a leukocytosis. CMP and lipase were pending, she was discharged with treatment for gastritis. The CMP came back revealing mildly elevated AST and ALT, I have ordered an abdominal ultrasound to be done in the morning. I called the patient and informed her of the results and the plan, she will come back to the hospital in the morning to get the ultrasound done. She will call our facility in the morning speak to someone to make sure she goes to the right place. In the meantime, she will go to the emergency department if she has any worsening or she starts to get sick with increasing pain, fever, or vomiting. Her lipase was normal. She has  also been referred to gastroenterology for followup because she does not have a primary care physician.  Meds ordered this encounter  Medications  . fluticasone (FLONASE) 50 MCG/ACT nasal spray    Sig: Place into both nostrils daily.  Marland Kitchen gi cocktail (Maalox,Lidocaine,Donnatal)    Sig:   . omeprazole (PRILOSEC) 40 MG capsule    Sig: Take 1 capsule (40 mg total) by mouth daily.    Dispense:  30 capsule    Refill:  0    Order Specific Question:  Supervising Provider    Answer:  Clementeen GrahamOREY, Luisangel Wainright, Kathie RhodesS [[id number]]  . sucralfate (CARAFATE) 1 GM/10ML suspension    Sig: Take 10 mLs (1 g total) by mouth 4 (four) times daily -  with meals and at bedtime.    Dispense:  420 mL    Refill:  0    Order Specific Question:  Supervising Provider    Answer:  Clementeen GrahamOREY, Everett Ehrler, Kathie RhodesS [[id number]]     Graylon GoodZachary H Baker, PA-C 09/01/13 2211  Graylon GoodZachary H Baker, PA-C 09/01/13 2212   Discussed the case with the patient. I also called radiology scheduling.  Scheduling will call the patient and arrange a time for the ultrasound.   Medical screening examination/treatment/procedure(s) were performed by a resident physician or non-physician practitioner and as the supervising physician I was immediately available for consultation/collaboration.  Clementeen GrahamEvan Lariya Kinzie, MD   Rodolph BongEvan S Janari Yamada, MD 09/02/13 [phone]

## 2013-09-03 ENCOUNTER — Other Ambulatory Visit: Payer: Self-pay | Admitting: Obstetrics and Gynecology

## 2013-09-03 ENCOUNTER — Ambulatory Visit (HOSPITAL_COMMUNITY)
Admission: RE | Admit: 2013-09-03 | Discharge: 2013-09-03 | Disposition: A | Discharge status: Home / Self Care | Payer: 59 | Source: Ambulatory Visit | Attending: Emergency Medicine | Admitting: Emergency Medicine

## 2013-09-03 DIAGNOSIS — R1011 Right upper quadrant pain: Secondary | ICD-10-CM | POA: Insufficient documentation

## 2013-09-07 ENCOUNTER — Telehealth (HOSPITAL_COMMUNITY): Payer: Self-pay | Admitting: Family Medicine

## 2013-09-07 NOTE — ED Notes (Signed)
Accessed secondary to patient phone call

## 2013-09-07 NOTE — ED Notes (Signed)
Chart and abdominal u/s results given to dr Denyse Amasscorey.  Will contact patient.  Patient left message he wanted results

## 2013-09-07 NOTE — ED Notes (Signed)
Called patient back regarding her abdominal pain.  Advised f/u with PCP.  Discussed US results.   Rodolph BongEvan S Jaquana Geiger, MD 09/07/13 [phone]

## 2013-09-07 NOTE — ED Notes (Signed)
Patient denies pain and is resting comfortably.  

## 2014-04-08 ENCOUNTER — Other Ambulatory Visit: Payer: Self-pay

## 2014-06-01 ENCOUNTER — Telehealth: Payer: Self-pay | Admitting: Family

## 2014-06-01 DIAGNOSIS — H109 Unspecified conjunctivitis: Secondary | ICD-10-CM

## 2014-06-01 MED ORDER — POLYMYXIN B-TRIMETHOPRIM 10000-0.1 UNIT/ML-% OP SOLN: 1.0000 [drp] | Freq: Four times a day (QID) | OPHTHALMIC | Status: AC

## 2014-06-01 NOTE — Progress Notes (Signed)
We are sorry that you are not feeling well.  Here is how we plan to help!  Based on what you have shared with me it looks like you have conjunctivitis.  Conjunctivitis is a common inflammatory or infectious condition of the eye that is often referred to as "pink eye".  In most cases it is contagious (viral or bacterial). However, not all conjunctivitis requires antibiotics (ex. Allergic).  We have made appropriate suggestions for you based upon your presentation.  I have prescribed Polytrim Ophthalmic drops 1-2 drops 4 times a day times 5 days   *Your prescriptions went to [facility] in [place], [place].   Pink eye can be highly contagious.  It is typically spread through direct contact with secretions, or contaminated objects or surfaces that one may have touched.  Strict handwashing is suggested with soap and water is urged.  If not available, use alcohol based had sanitizer.  Avoid unnecessary touching of the eye.  If you wear contact lenses, you will need to refrain from wearing them until you see no white discharge from the eye for at least 24 hours after being on medication.  You should see symptom improvement in 1-2 days after starting the medication regimen.  Call us if symptoms are not improved in 1-2 days.  Home Care:  Wash your hands often!  Do not wear your contacts until you complete your treatment plan.  Avoid sharing towels, bed linen, personal items with a person who has pink eye.  See attention for anyone in your home with similar symptoms.  Get Help Right Away If:  Your symptoms do not improve.  You develop blurred or loss of vision.  Your symptoms worsen (increased discharge, pain or redness)  Your e-visit answers were reviewed by a board certified advanced clinical practitioner to complete your personal care plan.  Depending on the condition, your plan could have included both over the counter or prescription medications.  Please review your pharmacy choice.  If there is a  problem, you may call our nursing hot line at [phone] and have the prescription routed to another pharmacy.  Your safety is important to us.  If you have drug allergies check your prescription carefully.    You can use MyChart to ask questions about today's visit, request a non-urgent call back, or ask for a work or school excuse.  You will get an e-mail in the next two days asking about your experience.  I hope that your e-visit has been valuable and will speed your recovery. Thank you for using e-visits.

## 2014-06-03 ENCOUNTER — Ambulatory Visit: Payer: PRIVATE HEALTH INSURANCE | Attending: Family Medicine | Admitting: Physical Therapy

## 2014-06-03 DIAGNOSIS — M25511 Pain in right shoulder: Secondary | ICD-10-CM | POA: Diagnosis not present

## 2014-06-03 NOTE — Therapy (Signed)
[facility] [address] [place], [place], [address] Phone: [phone]   Fax:  [phone]  Physical Therapy Evaluation  Patient Details  Name: Cynthia DellJana [name] MRN: [id number] Date of Birth: [birth year]  Encounter Date: 06/03/2014      PT End of Session - 06/03/14 1055    Visit Number 1   Number of Visits 6   Date for PT Re-Evaluation 08/02/14   Authorization - Number of Visits 6   PT Start Time 0930   PT Stop Time 1008   PT Time Calculation (min) 38 min   Activity Tolerance Patient tolerated treatment well   Behavior [facility] for tasks assessed/performed      Past Medical History  Diagnosis Date  . Delivery by emergency caesarean section     Past Surgical History  Procedure Laterality Date  . Cesarean section       x 3 '04, '08, '10    There were no vitals taken for this visit.  Visit Diagnosis:  Pain in joint, shoulder region, right - Plan: PT plan of care cert/re-cert      Subjective Assessment - 06/03/14 0933    Symptoms Pt is a 38 y/o female who presents to OPPT for RUE pain following flu vaccine.  Pt reports episode of waving RUE and then difficulty with moving UE.  Pt reports improvements overall, just still residual pain.   Patient Stated Goals decrease pain, return to regular activity   Currently in Pain? Yes   Pain Score 3    Pain Location Arm  shoulder   Pain Orientation Right   Pain Descriptors / Indicators Aching   Pain Type Other (Comment)  sub acute   Pain Frequency Intermittent   Aggravating Factors  overuse, morning   Pain Relieving Factors rest          [facility] Assessment - 06/03/14 0937    Assessment   Medical Diagnosis R UE pain 2/2 flu vaccine   Onset Date 04/11/14   Next MD Visit PRN   Prior Therapy n/a   Precautions   Precautions None   Restrictions   Weight Bearing Restrictions No   Balance Screen   Has the patient fallen in the past 6 months No   Has the patient had a  decrease in activity level because of a fear of falling?  No   Is the patient reluctant to leave their home because of a fear of falling?  No   Prior Function   Level of Independence Independent with basic ADLs;Independent with gait;Independent with transfers   Vocation Part time employment   Presenter, broadcastingVocation Requirements lactation consultant; on feet working with patients   Leisure household chores are limited due to pain   Observation/Other Assessments   Observations mild tenderness to palpation along middle and ant deltoid   AROM   Overall AROM Comments bil UEs WNL:   Strength   Right Shoulder Flexion 5/5  pain   Right Shoulder Extension 5/5  pain   Right Shoulder ABduction 5/5  pain   Right Shoulder Internal Rotation 5/5   Right Shoulder External Rotation 5/5   Left Shoulder Flexion 5/5   Left Shoulder Extension 5/5   Left Shoulder ABduction 5/5   Left Shoulder Internal Rotation 5/5   Left Shoulder External Rotation 5/5   Right Elbow Flexion 5/5   Right Elbow Extension 5/5  pain   Left Elbow Flexion 5/5   Left Elbow Extension 5/5  [facility] Adult PT Treatment/Exercise - 06/03/14 1023    Modalities   Modalities Ultrasound   Ultrasound   Ultrasound Location R ant/middle deltoid   Ultrasound Parameters 1.2 w/cm2; 1mHz, continuous DC x 8 min   Ultrasound Goals Pain   Manual Therapy   Manual Therapy Massage;Myofascial release   Massage R ant/middle deltoid including CFM and stroking to decrease pain and improve function.   Myofascial Release R ant/middle deltoids          PT Education - 06/03/14 1054    Education provided Yes   Education Details injury and modalities as needed   Person(s) Educated Patient   Methods Explanation;Demonstration   Comprehension Verbalized understanding            PT Long Term Goals - 06/03/14 1057    PT LONG TERM GOAL #1   Title independent with HEP (07/15/14)   Time 6   Period Weeks   Status New   PT LONG TERM GOAL #2    Title no pain with RUE resistive movements (07/15/14)   Time 6   Period Weeks   Status New   PT LONG TERM GOAL #3   Title report ability to return to household chores without pain (07/15/14)   Time 6   Period Weeks   Status New          Plan - 06/03/14 1055    Clinical Impression Statement Pt presents to OPPT with increased pain in R deltoids following flu vaccine.  Will benefit from PT to address pain and return to prior function without pain.   Pt will benefit from skilled therapeutic intervention in order to improve on the following deficits Pain;Impaired UE functional use   Rehab Potential Good   PT Frequency 1x / week   PT Duration 6 weeks   PT Treatment/Interventions ADLs/Self Care Home Management;Electrical Stimulation;Therapeutic exercise;Patient/family education;Moist Heat;Functional mobility training;Manual techniques;Passive range of motion;Dry needling;Therapeutic activities;Ultrasound   PT Next Visit Plan reassess, modalities as needed; stretching HEP   Consulted and Agree with Plan of Care Patient                              Problem List There are no active problems to display for this patient.  Clarita CraneStephanie F Kennady Zimmerle, PT, DPT 06/03/2014 11:09 AM  [facility] [address] [place], [place] [address]  [phone] (office) [phone] (fax)

## 2014-06-10 ENCOUNTER — Ambulatory Visit: Payer: 59 | Attending: Family Medicine | Admitting: Physical Therapy

## 2014-06-10 DIAGNOSIS — M25511 Pain in right shoulder: Secondary | ICD-10-CM | POA: Diagnosis not present

## 2014-06-10 NOTE — Patient Instructions (Signed)
Resisted External Rotation: in Neutral - Bilateral   Sit or stand, tubing in both hands, elbows at sides, bent to 90, forearms forward. Pinch shoulder blades together and rotate forearms out. Keep elbows at sides. Repeat ___10-20_ times per set. Do ___1-2_ sets per session. Do ___1_ sessions per day.  http://orth.exer.us/967   Copyright  VHI. All rights reserved.  Posterior Capsule Stretch   Stand or sit, one arm across body so hand rests over opposite shoulder. Gently push on crossed elbow with other hand until stretch is felt in shoulder of crossed arm. Hold __30_ seconds.  Repeat __2-3_ times per session. Do _2__ sessions per day.  Copyright  VHI. All rights reserved.

## 2014-06-10 NOTE — Therapy (Signed)
[facility] [address] [place], [place], [address] Phone: [phone]   Fax:  [phone]  Physical Therapy Treatment  Patient Details  Name: Cynthia DellJana [name] MRN: [id number] Date of Birth: [birth year]  Encounter Date: 06/10/2014      PT End of Session - 06/10/14 1100    Visit Number 2   Number of Visits 6   Date for PT Re-Evaluation 08/02/14   PT Start Time 1016   PT Stop Time 1058   PT Time Calculation (min) 42 min   Activity Tolerance Patient tolerated treatment well      Past Medical History  Diagnosis Date  . Delivery by emergency caesarean section     Past Surgical History  Procedure Laterality Date  . Cesarean section       x 3 '04, '08, '10    There were no vitals taken for this visit.  Visit Diagnosis:  Pain in joint, shoulder region, right      Subjective Assessment - 06/10/14 1020    Symptoms Pt. reports pain still presetnt, has moved slightly anterior and is usually mild no none.  Most days wakes with increased pain.  Some days she has none during the day.     Limitations House hold activities   Patient Stated Goals decrease pain, return to regular activity   Currently in Pain? Yes   Pain Score 2    Pain Location Arm   Pain Orientation Right;Anterior;Lateral   Pain Descriptors / Indicators Aching   Pain Onset More than a month ago   Pain Frequency Intermittent   Multiple Pain Sites No          [facility] PT Assessment - 06/10/14 1058    AROM   Overall AROM Comments --  Bilat. WNL   PROM   Right Shoulder Internal Rotation --  decreased end range, tight pos capsule   Right Shoulder External Rotation --  WNL no pain   Left Shoulder Internal Rotation --  Decreased >than Rt end range, post capsule tight   Left Shoulder External Rotation --  WNL no pain                  [facility] Adult PT Treatment/Exercise - 06/10/14 1111    Shoulder Exercises: ROM/Strengthening   Other  ROM/Strengthening Exercises Unattached ER/IR with blue band x10   Other ROM/Strengthening Exercises post capsule stretch Bilat.    Modalities   Modalities Ultrasound   Ultrasound   Ultrasound Location --  Rt ant/lat deltoid   Ultrasound Parameters --  100% duty, 1 MHz, 1.2 W/cm2, 8 min with biofreeze   Ultrasound Goals Pain   Manual Therapy   Manual Therapy Massage;Myofascial release  min tenderness, pain lateral deltoid and post R shldr   Massage --  to ant/lat deltoid and post cuff/triceps R, pec minor   Myofascial Release --  see above                PT Education - 06/10/14 1100    Education provided Yes   Education Details HEP for post capsule stretch and ER/IR strength/maintenance   Person(s) Educated Patient   Methods Explanation;Demonstration;Handout   Comprehension Verbalized understanding;Returned demonstration             PT Long Term Goals - 06/10/14 1110    PT LONG TERM GOAL #1   Title independent with HEP (07/15/14)   Status On-going   PT LONG TERM GOAL #2   Title no pain with RUE  resistive movements (07/15/14)   Status On-going   PT LONG TERM GOAL #3   Status On-going               Plan - 06/10/14 1115    Clinical Impression Statement Patient continues with mild "aggravating" soreness in R UE which does not limit function.  She did have decr IR bilaterally and was given HEP to preserve RC function. She will contiue 1 time per week for further treatment.    PT Frequency 1x / week   PT Duration 6 weeks   PT Next Visit Plan reassess, try taping? cont with manual, US   PT Home Exercise Plan post capsule and ER/IR band   Consulted and Agree with Plan of Care Patient        Problem List There are no active problems to display for this patient.   PAA,JENNIFER 06/10/2014, 11:18 AM  [facility] [address] [place], [place], [address] Phone: [phone]   Fax:   [phone] Karie MainlandJennifer Paa, PT 06/10/2014 11:18 AM Phone: [phone] Fax: [phone]

## 2014-06-16 ENCOUNTER — Ambulatory Visit: Payer: 59 | Admitting: Physical Therapy

## 2014-06-16 DIAGNOSIS — M25511 Pain in right shoulder: Secondary | ICD-10-CM

## 2014-06-16 NOTE — Patient Instructions (Signed)
Strengthening: Resisted Abduction   Hold tubing with right arm across body. Pull up and away from side. Move through pain-free range of motion.  *Anchor with other hand.* Repeat _10___ times per set. Do __1__ sets per session. Do _1-2___ sessions per day.  http://orth.exer.us/827   Copyright  VHI. All rights reserved.   Elbow Extension: Resisted   With tubing wrapped around right fist and other end anchored with left hand, straighten elbow. Repeat _10___ times per set. Do __1__ sets per session. Do _1-2___ sessions per day.  Copyright  VHI. All rights reserved.   Clarita CraneStephanie F Faraaz Wolin, PT, DPT 06/16/2014 8:25 AM  [facility] [address] [place], [place] [address]  [phone] (office) [phone] (fax)

## 2014-06-16 NOTE — Therapy (Signed)
[facility] [address] [place], [place], [address] Phone: [phone]   Fax:  [phone]  Physical Therapy Treatment  Patient Details  Name: Cynthia DellJana Ground MRN: [id number] Date of Birth: 1975/10/22  Encounter Date: 06/16/2014      PT End of Session - 06/16/14 0833    Visit Number 3   Number of Visits 6   Date for PT Re-Evaluation 08/02/14   PT Start Time 0753   PT Stop Time 0831   PT Time Calculation (min) 38 min   Activity Tolerance Patient tolerated treatment well   Behavior [facility] Therapy [facility] for tasks assessed/performed      Past Medical History  Diagnosis Date  . Delivery by emergency caesarean section     Past Surgical History  Procedure Laterality Date  . Cesarean section       x 3 '04, '08, '10    There were no vitals taken for this visit.  Visit Diagnosis:  Pain in joint, shoulder region, right      Subjective Assessment - 06/16/14 0753    Symptoms Still aching, feels like arm is getting "a little better."   Limitations House hold activities   Patient Stated Goals decrease pain, return to regular activity   Currently in Pain? Yes   Pain Score 2    Pain Location Arm   Pain Orientation Right;Anterior;Lateral   Pain Descriptors / Indicators Aching   Pain Type --  subacute   Pain Onset More than a month ago   Pain Frequency Intermittent   Aggravating Factors  overuse and in the mornings   Pain Relieving Factors rest   Multiple Pain Sites No                    [facility] Adult PT Treatment/Exercise - 06/16/14 0816    Shoulder Exercises: ROM/Strengthening   Other ROM/Strengthening Exercises Unattached ER/IR with blue band x10; unattached tricep push and shoulder abdct x 10 with blue band   Shoulder Exercises: Stretch   Other Shoulder Stretches internal capsule and RUE behind back stretch    Modalities   Modalities Ultrasound   Ultrasound   Ultrasound Location R ant/lateral shoulder   Ultrasound Parameters 1.2 w/cm2; 100%DC, 1mHz freq x 8 min   Ultrasound Goals Pain   Manual Therapy   Manual Therapy Massage;Myofascial release   Massage to ant/lat R deltoid and CFM along trigger points on lateral deltoid                PT Education - 06/16/14 0833    Education provided Yes   Education Details HEP, possible dry needling   Person(s) Educated Patient   Methods Explanation;Demonstration;Handout   Comprehension Verbalized understanding;Returned demonstration             PT Long Term Goals - 06/16/14 0834    PT LONG TERM GOAL #1   Title independent with HEP (07/15/14)   Time 6   Period Weeks   Status On-going   PT LONG TERM GOAL #2   Title no pain with RUE resistive movements (07/15/14)   Time 6   Period Weeks   Status On-going   PT LONG TERM GOAL #3   Title report ability to return to household chores without pain (07/15/14)   Time 6   Period Weeks   Status On-going               Plan - 06/16/14 [id number]    Clinical Impression Statement Pt continues  to report mild symptoms and tenderness on distal R lateral deltoid.  May benefit from dry needling to improve symptoms.   PT Next Visit Plan ? dry needling, manual, stretching   Consulted and Agree with Plan of Care Patient        Problem List There are no active problems to display for this patient.   Clarita CraneStephanie F Leeasia Secrist, PT, DPT 06/16/2014 8:36 AM  [facility] [address] [place], [place], [address] Phone: [phone]   Fax:  [phone]

## 2014-06-23 ENCOUNTER — Ambulatory Visit: Payer: 59

## 2014-07-08 ENCOUNTER — Ambulatory Visit: Payer: PRIVATE HEALTH INSURANCE | Attending: Family Medicine | Admitting: Physical Therapy

## 2014-07-08 DIAGNOSIS — M25511 Pain in right shoulder: Secondary | ICD-10-CM | POA: Insufficient documentation

## 2014-07-08 NOTE — Therapy (Addendum)
[place], [place], [address] Phone: [phone]   Fax:  [phone]  Physical Therapy Treatment  Patient Details  Name: Manuel Lawhead MRN: [id number] Date of Birth: 10/08/1975 Referring Provider:  Odis Luster, MD  Encounter Date: 07/08/2014      PT End of Session - 07/08/14 0924    Visit Number 4   Number of Visits 6   Date for PT Re-Evaluation 08/02/14   Authorization - Number of Visits 6   PT Start Time 0845   PT Stop Time 0927   PT Time Calculation (min) 42 min   Activity Tolerance Patient tolerated treatment well   Behavior During Therapy [facility] for tasks assessed/performed      Past Medical History  Diagnosis Date  . Delivery by emergency caesarean section     Past Surgical History  Procedure Laterality Date  . Cesarean section       x 3 '04, '08, '10    There were no vitals taken for this visit.  Visit Diagnosis:  Pain in joint, shoulder region, right      Subjective Assessment - 07/08/14 0846    Symptoms Still has occasional episodes of pain.  Stretches help.  Reaching up and occaasionally waking up with pain still present. Intermittent with no consistent pattern.   Limitations House hold activities   Patient Stated Goals decrease pain, return to regular activity   Currently in Pain? Yes   Pain Score 1    Pain Location Arm   Pain Orientation Right;Anterior;Lateral   Pain Descriptors / Indicators Aching   Pain Onset More than a month ago   Pain Frequency Intermittent                    [facility] Adult PT Treatment/Exercise - 07/08/14 0923    Modalities   Modalities Moist Heat   Moist Heat Therapy   Number Minutes Moist Heat 10 Minutes   Moist Heat Location Shoulder   Manual Therapy   Manual Therapy Myofascial release   Myofascial Release myofacial release and soft tissue mobilization to middle and posterior deltoid          Trigger Point Dry Needling -  07/08/14 2952    Consent Given? Yes   Education Handout Provided Yes   Muscles Treated Upper Body --  middle and post deltoid; twitch response elicited              PT Education - 07/08/14 0923    Education provided Yes   Education Details dry needling   Person(s) Educated Patient   Methods Explanation;Handout   Comprehension Verbalized understanding             PT Long Term Goals - 07/08/14 0929    PT LONG TERM GOAL #1   Title independent with HEP (07/15/14)   Status On-going   PT LONG TERM GOAL #2   Title no pain with RUE resistive movements (07/15/14)   Status On-going   PT LONG TERM GOAL #3   Title report ability to return to household chores without pain (07/15/14)   Status On-going               Plan - 07/08/14 0924    Clinical Impression Statement PT performed dry needling (performed by Ruben Im, PT) to medial and posterior deltoid with twitch response elicited.  Following session decreased trigger point size and number noted.     Pt will benefit from skilled therapeutic intervention  in order to improve on the following deficits Pain;Impaired UE functional use   PT Next Visit Plan reassess and treat as indicated   Consulted and Agree with Plan of Care Patient        Problem List There are no active problems to display for this patient.   Laureen Abrahams, PT, DPT 07/08/2014 9:30 AM  [place], [place], [address] Phone: [phone]   Fax:  [phone]     PHYSICAL THERAPY DISCHARGE SUMMARY  Visits from Start of Care: 4  Current functional level related to goals / functional outcomes: See above; pt canceled last appt for unknown reason and did not reschedule.  Pt was improving with PT and therefore anticipate no longer needed however true reason unknown.   Remaining deficits: unknown   Education / Equipment: HEP  Plan: Patient agrees to discharge.   Patient goals were not met. Patient is being discharged due to not returning since the last visit.  ?????    Laureen Abrahams, PT, DPT 08/26/2014 8:51 AM   [facility] [address] [place] [address]  [phone] (office) [phone] (fax)

## 2014-07-08 NOTE — Patient Instructions (Signed)
Trigger Point Dry Needling   What is Trigger Point Dry Needling (DN)?   1. DN is a physical therapy technique used to treat muscle pain and     Dysfunction.  Specifically, DN helps deactivate muscle trigger    points (Muscle Knots).   2. A thin filiform needle is used to penetrate the skin and stimulate    the underlying trigger point.  The goal is for a local twitch     response (LTR) to occur and for the trigger point to relax.  No    medication of any kind is injected during the procedure.   What Does Trigger Point Dry Needling Feel Like?   1. The procedures feels different for each individual patient.   Some    patients report that they do not actually feel the      needle enter the skin and overall the process is not  painful.  Very    mild bleeding may occur.  However, many patients feel a deep    cramping in the muscle in which the needle was inserted. This is t   he local twitch response.    How Will I Feel After The Treatment?   1. Soreness is normal, and the onset of soreness may not occur for    a few hours.  Typically this soreness does not last longer than two    days.   2. Bruising is uncommon, however; ice can be used to decrease any    possible bruising.   3. In rare cases feeling tired or nauseous after the treatment is    normal.  In addition, your symptoms may get worse before they    get better, this period will typically not last longer than 24 hours.   What Can I do After My Treatment?   1.  Increase your hydration by drinking more water for the next 24    hours.   2.  You may place ice or heat on the areas treated that have become    sore, however don not use heat on inflamed or bruised areas.     Heat often brings more relief post needling.   3. You can continue your regular activities, but vigorous activity is    not recommended initially after the treatment for 24 hours.   4. DN is best combined with other physical therapy such as     strengthening, stretching, and  other therapies.   If needed, schedule follow up appointments with Garen LahLawrie Beardsley or Lavinia SharpsStacy Simpson.  Both are certified in dry needling.

## 2014-07-15 ENCOUNTER — Ambulatory Visit: Payer: PRIVATE HEALTH INSURANCE | Admitting: Physical Therapy

## 2014-07-26 ENCOUNTER — Ambulatory Visit: Payer: PRIVATE HEALTH INSURANCE | Admitting: Physical Therapy

## 2014-08-04 ENCOUNTER — Telehealth: Payer: 59 | Admitting: Physician Assistant

## 2014-08-04 DIAGNOSIS — J019 Acute sinusitis, unspecified: Principal | ICD-10-CM

## 2014-08-04 DIAGNOSIS — B9689 Other specified bacterial agents as the cause of diseases classified elsewhere: Secondary | ICD-10-CM

## 2014-08-04 MED ORDER — AMOXICILLIN-POT CLAVULANATE 875-125 MG PO TABS: 1.0000 | ORAL_TABLET | Freq: Two times a day (BID) | ORAL | Status: AC

## 2014-08-04 NOTE — Progress Notes (Signed)

## 2014-09-05 ENCOUNTER — Other Ambulatory Visit: Payer: Self-pay | Admitting: Dermatology

## 2014-10-26 ENCOUNTER — Other Ambulatory Visit: Payer: Self-pay | Admitting: Obstetrics and Gynecology

## 2014-10-27 LAB — CYTOLOGY - PAP

## 2014-12-27 ENCOUNTER — Other Ambulatory Visit: Payer: Self-pay | Admitting: Nurse Practitioner

## 2014-12-27 ENCOUNTER — Telehealth: Payer: 59 | Admitting: Nurse Practitioner

## 2014-12-27 DIAGNOSIS — M545 Low back pain: Secondary | ICD-10-CM

## 2014-12-27 MED ORDER — CYCLOBENZAPRINE HCL 10 MG PO TABS: 10.0000 mg | ORAL_TABLET | Freq: Three times a day (TID) | ORAL | Status: DC | PRN

## 2014-12-27 MED ORDER — NAPROXEN 500 MG PO TABS: 500.0000 mg | ORAL_TABLET | Freq: Two times a day (BID) | ORAL | Status: DC

## 2014-12-27 NOTE — Progress Notes (Signed)
We are sorry that you are not feeling well.  Here is how we plan to help!  Based on what you have shared with me it looks like you mostly have acute back pain.  Acute back pain is defined as musculoskeletal pain that can resolve in 1-3 weeks with conservative treatment.  I have prescribed Naprosyn 500 mg twice a day non-steroid anti-inflammatory (NSAID) as well as Flexeril 10 mg every eight hours as needed which is a muscle relaxer.  Some patients experience stomach irritation or in increased heartburn with anti-inflammatory drugs.  Please keep in mind that muscle relaxer's can cause fatigue and should not be taken while at work or driving.  Back pain is very common.  The pain often gets better over time.  The cause of back pain is usually not dangerous.  Most people can learn to manage their back pain on their own.  Home Care  Stay active.  Start with short walks on flat ground if you can.  Try to walk farther each day.  Do not sit, drive or stand in one place for more than 30 minutes.  Do not stay in bed.  Do not avoid exercise or work.  Activity can help your back heal faster.  Be careful when you bend or lift an object.  Bend at your knees, keep the object close to you, and do not twist.  Sleep on a firm mattress.  Lie on your side, and bend your knees.  If you lie on your back, put a pillow under your knees.  Only take medicines as told by your doctor.  Put ice on the injured area.  Put ice in a plastic bag  Place a towel between your skin and the bag  Leave the ice on for 15-20 minutes, 3-4 times a day for the first 2-3 days.  After that, you can switch between ice and heat packs.  Ask your doctor about back exercises or massage.  Avoid feeling anxious or stressed.  Find good ways to deal with stress, such as exercise.  Get Help Right Way If:  Your pain does not go away with rest or medicine.  Your pain does not go away in 1 week.  You have new problems.  You do not  feel well.  The pain spreads into your legs.  You cannot control when you poop (bowel movement) or pee (urinate)  You feel sick to your stomach (nauseous) or throw up (vomit)  You have belly (abdominal) pain.  You feel like you may pass out (faint).  If you develop a fever.  Make Sure you:  Understand these instructions.  Will watch your condition  Will get help right away if you are not doing well or get worse.  Your e-visit answers were reviewed by a board certified advanced clinical practitioner to complete your personal care plan.  Depending on the condition, your plan could have included both over the counter or prescription medications.  If there is a problem please reply  once you have received a response from your provider.  Your safety is important to us.  If you have drug allergies check your prescription carefully.    You can use MyChart to ask questions about today's visit, request a non-urgent call back, or ask for a work or school excuse.  You will get an e-mail in the next two days asking about your experience.  I hope that your e-visit has been valuable and will speed your recovery. Thank you   for using e-visits.   

## 2014-12-27 NOTE — Addendum Note (Signed)
Addended by: Bennie PieriniMARTIN, MARY-MARGARET on: 12/27/2014 09:17 PM   Modules accepted: Orders

## 2014-12-28 ENCOUNTER — Other Ambulatory Visit: Payer: Self-pay | Admitting: *Deleted

## 2014-12-28 MED ORDER — NAPROXEN 500 MG PO TABS: 500.0000 mg | ORAL_TABLET | Freq: Two times a day (BID) | ORAL | Status: DC

## 2015-03-21 ENCOUNTER — Ambulatory Visit: Payer: 59 | Attending: Family Medicine | Admitting: Physical Therapy

## 2015-03-21 DIAGNOSIS — M5442 Lumbago with sciatica, left side: Secondary | ICD-10-CM

## 2015-03-21 DIAGNOSIS — M6281 Muscle weakness (generalized): Secondary | ICD-10-CM | POA: Diagnosis present

## 2015-03-21 DIAGNOSIS — M5441 Lumbago with sciatica, right side: Secondary | ICD-10-CM

## 2015-03-21 DIAGNOSIS — M25552 Pain in left hip: Secondary | ICD-10-CM | POA: Diagnosis present

## 2015-03-21 NOTE — Patient Instructions (Signed)
Handouts on flexion avoidance, sitting postural correction and prone press up or standing extension 10x every 2 hours

## 2015-03-21 NOTE — Therapy (Signed)
[facility] [address] [place], [place], [address] Phone: [phone]   Fax:  [phone]  Physical Therapy Evaluation  Patient Details  Name: [name] MRN: [id number] Date of Birth: [birth year] Referring Provider:  Maurice Small, MD  Encounter Date: 03/21/2015      PT End of Session - 03/21/15 1947    Visit Number 1   Number of Visits 16   Date for PT Re-Evaluation 05/16/15   Authorization Type UMR   PT Start Time 1147   PT Stop Time 1237   PT Time Calculation (min) 50 min   Activity Tolerance Patient tolerated treatment well      Past Medical History  Diagnosis Date  . Delivery by emergency caesarean section     Past Surgical History  Procedure Laterality Date  . Cesarean section       x 3 '04, '08, '10    There were no vitals filed for this visit.  Visit Diagnosis:  Bilateral low back pain with left-sided sciatica - Plan: PT plan of care cert/re-cert  Hip pain, left - Plan: PT plan of care cert/re-cert  Muscle weakness of lower extremity - Plan: PT plan of care cert/re-cert  Right-sided low back pain with right-sided sciatica - Plan: PT plan of care cert/re-cert      Subjective Assessment - 03/21/15 1157    Subjective Over the summer chronic low back worsened with R/L LE symptoms.  Relying on increased med usage.  Reaching and twisting aggravates.     Pertinent History good response with dry needling shoulder last year;     Limitations House hold activities   How long can you sit comfortably? 20 min   How long can you stand comfortably? bothers singing in church   How long can you walk comfortably? varies   Diagnostic tests none   Patient Stated Goals manage pain without meds   Currently in Pain? Yes   Pain Score 1    Pain Location Back   Pain Orientation Right;Left;Mid   Pain Type Chronic pain   Pain Radiating Towards Right posterior thigh while sitting on the computer but can have Left LE  symptoms    Pain Onset More than a month ago   Pain Frequency Intermittent   Aggravating Factors  reaching, twisting, sometimes walking    Pain Relieving Factors arch back into extension helps sciatica, meds            [facility] PT Assessment - 03/21/15 1203    Assessment   Medical Diagnosis low back pain sciatica   Onset Date/Surgical Date 11/23/14   Hand Dominance Right   Next MD Visit end of the month   Prior Therapy for shoulder   Precautions   Precautions None   Restrictions   Weight Bearing Restrictions No   Balance Screen   Has the patient fallen in the past 6 months No   Has the patient had a decrease in activity level because of a fear of falling?  No   Is the patient reluctant to leave their home because of a fear of falling?  No   Home Environment   Living Environment Private residence   Living Arrangements Spouse/significant other;Children   Home Access Stairs to enter   Observation/Other Assessments   Focus on Therapeutic Outcomes (FOTO)  43% limitation   Posture/Postural Control   Posture Comments decreased lumbar lordosis   AROM   AROM Assessment Site Hip;Lumbar   Right/Left Hip Right;Left  Right Hip Extension 10   Right Hip External Rotation  40   Right Hip Internal Rotation  20   Left Hip Extension 10   Left Hip External Rotation  40   Left Hip Internal Rotation  20   Lumbar Flexion 90   Lumbar Extension 15   Lumbar - Right Side Bend 30   Lumbar - Left Side Bend 30   Strength   Strength Assessment Site Hip;Lumbar   Right/Left Hip Right;Left   Right Hip Extension 4/5   Right Hip ABduction 4/5   Left Hip Extension 4/5   Left Hip ABduction 4/5   Lumbar Flexion 4-/5   Lumbar Extension 4-/5   Palpation   Palpation comment --  tenderness in B piriformis   Special Tests    Special Tests Lumbar   Lumbar Tests Prone Knee Bend Test;Straight Leg Raise   Hip Special Tests  Luisa Hart (FABER) Test;Hip Scouring   Prone Knee Bend Test   Findings Positive    Side Left   Comment right prone knee causes left LBP   Straight Leg Raise   Side  Right   Comment LBP   Luisa Hart (FABER) Test   Findings Positive   Side Right   Hip Scouring   Findings Negative                           PT Education - 03/21/15 1946    Education provided Yes   Education Details postural and basic body mechanics;  lumbar extension    Person(s) Educated Patient   Methods Explanation;Demonstration;Handout   Comprehension Verbalized understanding          PT Short Term Goals - 03/21/15 1956    PT SHORT TERM GOAL #1   Title Patient will be independent in basic self care including postural correction when sitting, basic body mechanics with vacumning   Time 4   Period Weeks   Status New   PT SHORT TERM GOAL #2   Title Patient will report a 25% improvement in pain/function   Time 4   Period Weeks   Status New           PT Long Term Goals - 03/21/15 1957    PT LONG TERM GOAL #1   Title independent with HEP and safe self progression for further strengthening   Time 8   Period Weeks   Status New   PT LONG TERM GOAL #2   Title Bilateral hip and core strength 4+/5 needed for home and work duties   Time 8   Period Weeks   Status New   PT LONG TERM GOAL #3   Title Patient will report > 50% improvement in pain and decreased use of pain medication   Time 8   Period Weeks   Status New   PT LONG TERM GOAL #4   Title FOTO functional outcome score improved from 43% limitation to 33% indicating improved function with less pain   Time 8   Period Weeks   Status New               Plan - 03/21/15 1948    Clinical Impression Statement The patient is a 39 year old female with a long history of Low back pain.  She had an exacerbation over the summer for no apparent reason as well as the onset on "sciatic" pain down the posterior thigh of either her left or right.  She has  had to increase the amount of pain medication to control the  discomfort over the summer.  Her goal is to manage the pain without medication.  She has increased pain with reaching or twisting.  Her LE pain is better with bending back.  She had previous PT a year ago with with [facility] for shoulder pain and had excellent results with dry needling.  She wonders if that may help this problem.  Decreased lumbar lordosis.  Mildly tender bilateral piriformis and trochanteric bursa.  Lumbar AROM WFLs:  flex 90, ext 15. sidebend 30 degrees B.  Decreased core strength 4-/5.  Bilateral hip strength grossly 4/5.  Hip AROM WFLs.  Mild LBP with SLR and prone knee bend but no LE symptoms.  She would benefit from PT for treatment of myofascial pain, core and hip strengthening.     Pt will benefit from skilled therapeutic intervention in order to improve on the following deficits Pain;Increased muscle spasms;Decreased strength;Decreased range of motion   Rehab Potential Good   PT Frequency 2x / week   PT Duration 8 weeks   PT Treatment/Interventions Iontophoresis /ml Dexamethasone;Moist Heat;Electrical Stimulation;Cryotherapy;Ultrasound;Therapeutic exercise;Manual techniques;Dry needling;Taping;Patient/family education   PT Next Visit Plan dry needling; core and hip strengthening;  assess carryover with lumbar extension trial/HEP; hip and soft tissue mobilization; heat and modalties as needed         Problem List There are no active problems to display for this patient.   Vivien Presto 03/21/2015, 8:05 PM  [facility] [address] [place], [place], [address] Phone: [phone]   Fax:  [phone]   Lavinia Sharps, PT 03/21/2015 8:06 PM Phone: [phone] Fax: [phone]

## 2015-03-28 ENCOUNTER — Ambulatory Visit: Payer: 59 | Attending: Family Medicine | Admitting: Physical Therapy

## 2015-03-28 DIAGNOSIS — M25552 Pain in left hip: Secondary | ICD-10-CM | POA: Diagnosis present

## 2015-03-28 DIAGNOSIS — M5442 Lumbago with sciatica, left side: Secondary | ICD-10-CM | POA: Insufficient documentation

## 2015-03-28 DIAGNOSIS — M25511 Pain in right shoulder: Secondary | ICD-10-CM | POA: Insufficient documentation

## 2015-03-28 DIAGNOSIS — M5441 Lumbago with sciatica, right side: Secondary | ICD-10-CM | POA: Diagnosis present

## 2015-03-28 DIAGNOSIS — M6281 Muscle weakness (generalized): Secondary | ICD-10-CM | POA: Insufficient documentation

## 2015-03-28 NOTE — Therapy (Signed)
[place], [place], [address] Phone: [phone]   Fax:  [phone]  Physical Therapy Treatment  Patient Details  Name: [name] MRN: [id number] Date of Birth: 06-Jun-1976 Referring Provider:  Kelton Pillar, MD  Encounter Date: 03/28/2015      PT End of Session - 03/28/15 0908    Visit Number 2   Number of Visits 16   Date for PT Re-Evaluation 05/16/15   Authorization Type UMR   PT Start Time 0830   PT Stop Time 0914   PT Time Calculation (min) 44 min   Activity Tolerance Patient tolerated treatment well   Behavior During Therapy [facility] for tasks assessed/performed      Past Medical History  Diagnosis Date  . Delivery by emergency caesarean section     Past Surgical History  Procedure Laterality Date  . Cesarean section       x 3 '04, '08, '10    There were no vitals filed for this visit.  Visit Diagnosis:  Bilateral low back pain with left-sided sciatica  Hip pain, left  Muscle weakness of lower extremity  Right-sided low back pain with right-sided sciatica      Subjective Assessment - 03/28/15 0833    Subjective Back is a little sore today. Aching in the morning more than in the morning and in back of leg when sitting.     Pertinent History good response with dry needling shoulder last year;     Limitations House hold activities   How long can you sit comfortably? 20 min   Currently in Pain? Yes   Pain Score 4    Pain Location Back   Pain Orientation Right;Left;Mid   Pain Type Chronic pain   Pain Onset More than a month ago   Pain Frequency Intermittent   Aggravating Factors  reaching, twisting, sometimes walking   Pain Relieving Factors arch back into extension helps sciatica, meds                         [facility] Adult PT Treatment/Exercise - 03/28/15 0837    Lumbar Exercises: Aerobic   Stationary Bike NuStep x 8 min Level 5   Lumbar Exercises: Supine    Ab Set 15 reps;3 seconds   Clam 15 reps   Clam Limitations with ab set   Bent Knee Raise 15 reps   Bent Knee Raise Limitations with ab set   Other Supine Lumbar Exercises bridging with isometric hip abdct (with strap) x 15    Lumbar Exercises: Prone   Other Prone Lumbar Exercises prone press up x 15   Other Prone Lumbar Exercises prone prop on elbows x 2 min   Modalities   Modalities Electrical Stimulation;Moist Heat   Moist Heat Therapy   Number Minutes Moist Heat 15 Minutes   Moist Heat Location Lumbar Spine   Electrical Stimulation   Electrical Stimulation Location low back   Electrical Stimulation Action IFC   Electrical Stimulation Parameters to tolerance up to 10 mA   Electrical Stimulation Goals Pain                  PT Short Term Goals - 03/28/15 0909    PT SHORT TERM GOAL #1   Title Patient will be independent in basic self care including postural correction when sitting, basic body mechanics with vacumning   Time 4   Period Weeks   Status On-going   PT  SHORT TERM GOAL #2   Title Patient will report a 25% improvement in pain/function   Time 4   Period Weeks   Status On-going           PT Long Term Goals - 03/28/15 0909    PT LONG TERM GOAL #1   Title independent with HEP and safe self progression for further strengthening   Time 8   Period Weeks   Status On-going   PT LONG TERM GOAL #2   Title Bilateral hip and core strength 4+/5 needed for home and work duties   Time 8   Period Weeks   Status On-going   PT LONG TERM GOAL #3   Title Patient will report > 50% improvement in pain and decreased use of pain medication   Time 8   Period Weeks   Status On-going   PT LONG TERM GOAL #4   Title FOTO functional outcome score improved from 43% limitation to 33% indicating improved function with less pain   Time 8   Period Weeks   Status On-going               Plan - 03/28/15 0908    Clinical Impression Statement Pt reports increased  soreness this morning.  Pt states she is more sore in AM and noticed pain in back of leg mostly with sitting.  Initiated core exercises today and will monitor progress and pain.  No goals met as only 2nd visit.   PT Next Visit Plan dry needling; core and hip strengthening;  assess carryover with lumbar extension trial/HEP; hip and soft tissue mobilization; heat and modalties as needed   Consulted and Agree with Plan of Care Patient        Problem List There are no active problems to display for this patient.  Laureen Abrahams, PT, DPT 03/28/2015 9:25 AM  [facility] [address] [place], [place], [address] Phone: [phone]   Fax:  [phone]

## 2015-03-31 ENCOUNTER — Ambulatory Visit: Payer: 59 | Admitting: Physical Therapy

## 2015-03-31 DIAGNOSIS — M25552 Pain in left hip: Secondary | ICD-10-CM

## 2015-03-31 DIAGNOSIS — M5442 Lumbago with sciatica, left side: Secondary | ICD-10-CM

## 2015-03-31 DIAGNOSIS — M6281 Muscle weakness (generalized): Secondary | ICD-10-CM

## 2015-03-31 DIAGNOSIS — M5441 Lumbago with sciatica, right side: Secondary | ICD-10-CM

## 2015-03-31 NOTE — Therapy (Signed)
[facility] [address] [place], [place], [address] Phone: [phone]   Fax:  [phone]  Physical Therapy Treatment  Patient Details  Name: [name] MRN: [id number] Date of Birth: 1975-09-10 Referring Provider:  Maurice Small, MD  Encounter Date: 03/31/2015      PT End of Session - 03/31/15 0911    Visit Number 3   Number of Visits 16   Date for PT Re-Evaluation 05/16/15   Authorization Type UMR   PT Start Time 0827   PT Stop Time 0920   PT Time Calculation (min) 53 min   Activity Tolerance Patient tolerated treatment well   Behavior During Therapy [facility] for tasks assessed/performed      Past Medical History  Diagnosis Date  . Delivery by emergency caesarean section     Past Surgical History  Procedure Laterality Date  . Cesarean section       x 3 '04, '08, '10    There were no vitals filed for this visit.  Visit Diagnosis:  Bilateral low back pain with left-sided sciatica  Hip pain, left  Muscle weakness of lower extremity  Right-sided low back pain with right-sided sciatica      Subjective Assessment - 03/31/15 0829    Subjective Feels good today, tried heat at work Wednesday and worked well.  Still having some sciatic nerve pain with sitting.     Patient Stated Goals manage pain without meds   Currently in Pain? No/denies  no back pain, feeling some sciatic pain down LLE                         [facility] Adult PT Treatment/Exercise - 03/31/15 0831    Lumbar Exercises: Stretches   Passive Hamstring Stretch 3 reps;30 seconds   Piriformis Stretch 2 reps;30 seconds   Lumbar Exercises: Aerobic   Stationary Bike NuStep x 8 min Level 5   Lumbar Exercises: Supine   Other Supine Lumbar Exercises bridging with isometric hip abdct (with strap) x 15    Other Supine Lumbar Exercises isometric hip ext x15 LLE   Lumbar Exercises: Prone   Single Arm Raise 15 reps;2 seconds;Right;Left   Straight Leg Raise 15 reps   Straight Leg Raises Limitations resolve of symptoms    Opposite Arm/Leg Raise Right arm/Left leg;Left arm/Right leg;15 reps   Modalities   Modalities Electrical Stimulation;Moist Heat   Moist Heat Therapy   Number Minutes Moist Heat 15 Minutes   Moist Heat Location Lumbar Spine   Electrical Stimulation   Electrical Stimulation Location low back   Electrical Stimulation Action IFC   Electrical Stimulation Parameters to tolerance up to 10 mA   Electrical Stimulation Goals Pain                PT Education - 03/31/15 0910    Education provided Yes   Education Details HEP; core exercises, prone alt UE/LE, bridging with isometric hip abdct   Person(s) Educated Patient   Methods Explanation;Demonstration;Handout   Comprehension Verbalized understanding;Returned demonstration;Need further instruction          PT Short Term Goals - 03/28/15 0909    PT SHORT TERM GOAL #1   Title Patient will be independent in basic self care including postural correction when sitting, basic body mechanics with vacumning   Time 4   Period Weeks   Status On-going   PT SHORT TERM GOAL #2   Title Patient will report a 25% improvement  in pain/function   Time 4   Period Weeks   Status On-going           PT Long Term Goals - 03/28/15 0909    PT LONG TERM GOAL #1   Title independent with HEP and safe self progression for further strengthening   Time 8   Period Weeks   Status On-going   PT LONG TERM GOAL #2   Title Bilateral hip and core strength 4+/5 needed for home and work duties   Time 8   Period Weeks   Status On-going   PT LONG TERM GOAL #3   Title Patient will report > 50% improvement in pain and decreased use of pain medication   Time 8   Period Weeks   Status On-going   PT LONG TERM GOAL #4   Title FOTO functional outcome score improved from 43% limitation to 33% indicating improved function with less pain   Time 8   Period Weeks   Status  On-going               Plan - 03/31/15 0911    Clinical Impression Statement Pt states she hasn't taken NSAID since Sunday 10/2.  Overall reports feeling better with decreased pain, except still with some sciatic symptoms in sitting.  Prone hip ext eliminated symptoms with repeated movements.  Will continue to benefit from PT to maximize function.   PT Next Visit Plan dry needling; core and hip strengthening;  assess carryover with lumbar extension trial/HEP; hip and soft tissue mobilization; heat and modalties as needed   PT Home Exercise Plan core and SI stabilization   Consulted and Agree with Plan of Care Patient        Problem List There are no active problems to display for this patient.   Clarita Crane, PT, DPT 03/31/2015 9:22 AM  [facility] [address] [place], [place], [address] Phone: [phone]   Fax:  [phone]

## 2015-03-31 NOTE — Patient Instructions (Signed)
Bridging    Slowly raise buttocks from floor, keeping stomach tight.  Add strap around thighs and push into strap. Repeat __15__ times per set. Do _1___ sets per session. Do _1-2___ sessions per day.  http://orth.exer.us/1097   Copyright  VHI. All rights reserved.   Bracing With Arm / Leg Lift (Prone)    With pillow support, lie on abdomen. Find neutral spine.  Alternately raise one arm and opposite leg. Repeat _15__ times. Do 1-2__ times a day.   Copyright  VHI. All rights reserved.    PELVIC TILT  Lie on back, legs bent. Exhale, tilting top of pelvis back, pubic bone up, to flatten lower back. Inhale, rolling pelvis opposite way, top forward, pubic bone down, arch in back. Repeat __10__ times. Do __2__ sessions per day. Copyright  VHI. All rights reserved.     Lie with hips and knees bent. Slowly inhale, and then exhale. Pull navel toward spine and tighten pelvic floor. Hold for __10_ seconds. Continue to breathe in and out during hold. Rest for _10__ seconds. Repeat __10_ times. Do __2-3_ times a day.   Copyright  VHI. All rights reserved.  Knee Fold   Lie on back, legs bent, arms by sides. Exhale, lifting knee to chest. Inhale, returning. Keep abdominals flat, navel to spine. Repeat __10__ times, alternating legs. Do __2__ sessions per day.  Copyright  VHI. All rights reserved.  Knee Drop   Keep pelvis stable. Without rotating hips, slowly drop knee to side, pause, return to center, bring knee across midline toward opposite hip. Feel obliques engaging. Repeat for ___10_ times each leg.  Copyright  VHI. All rights reserved.  Isometric Hold With Pelvic Floor (Hook-Lying)    Copyright  VHI. All rights reserved.  Heel Slide to Straight   Slide one leg down to straight. Return. Be sure pelvis does not rock forward, tilt, rotate, or tip to side. Do _10__ times. Restabilize pelvis. Repeat with other leg. Do __1-2_ sets, __2_ times per  day.  http://ss.exer.us/16   Copyright  VHI. All rights reserved.

## 2015-04-04 ENCOUNTER — Ambulatory Visit: Payer: 59 | Admitting: Physical Therapy

## 2015-04-04 DIAGNOSIS — M6281 Muscle weakness (generalized): Secondary | ICD-10-CM

## 2015-04-04 DIAGNOSIS — M5441 Lumbago with sciatica, right side: Secondary | ICD-10-CM

## 2015-04-04 DIAGNOSIS — M5442 Lumbago with sciatica, left side: Secondary | ICD-10-CM

## 2015-04-04 DIAGNOSIS — M25552 Pain in left hip: Secondary | ICD-10-CM

## 2015-04-04 DIAGNOSIS — M25511 Pain in right shoulder: Secondary | ICD-10-CM

## 2015-04-04 NOTE — Therapy (Signed)
[facility] [address] [place], [place], [address] Phone: [phone]   Fax:  [phone]  Physical Therapy Treatment  Patient Details  Name: [name] MRN: [id number] Date of Birth: 1975-10-06 Referring Provider:  Maurice Small, MD  Encounter Date: 04/04/2015      PT End of Session - 04/04/15 1254    Visit Number 4   Number of Visits 16   Date for PT Re-Evaluation 05/16/15   Authorization Type UMR   PT Start Time 1015   PT Stop Time 1100   PT Time Calculation (min) 45 min   Activity Tolerance Patient tolerated treatment well   Behavior During Therapy [facility] for tasks assessed/performed      Past Medical History  Diagnosis Date  . Delivery by emergency caesarean section     Past Surgical History  Procedure Laterality Date  . Cesarean section       x 3 '04, '08, '10    There were no vitals filed for this visit.  Visit Diagnosis:  Bilateral low back pain with left-sided sciatica  Hip pain, left  Muscle weakness of lower extremity  Right-sided low back pain with right-sided sciatica  Pain in joint, shoulder region, right      Subjective Assessment - 04/04/15 1020    Subjective " Over the weekend I have had more pain in the low back pain"   Currently in Pain? Yes   Pain Score 1    Pain Location Back   Pain Orientation Left;Right   Pain Descriptors / Indicators Aching;Sore   Pain Type Chronic pain   Pain Onset More than a month ago   Pain Frequency Intermittent   Aggravating Factors  prolonged sitting   Pain Relieving Factors naperson, multifiuds exercises                         [facility] Adult PT Treatment/Exercise - 04/04/15 0001    Lumbar Exercises: Stretches   Active Hamstring Stretch 4 reps;30 seconds  PNF contract relax x 10 sec hold   Piriformis Stretch 2 reps;30 seconds   Lumbar Exercises: Aerobic   Stationary Bike NuStep x 8 min Level 5   Lumbar Exercises: Supine   Bent Knee Raise 15 reps   Bent Knee Raise Limitations with ab set   Moist Heat Therapy   Number Minutes Moist Heat 10 Minutes   Moist Heat Location Lumbar Spine   Manual Therapy   Manual Therapy Joint mobilization;Soft tissue mobilization   Joint Mobilization resisted R hip flexion with anterior innominate mobilizations  in L sidelying   Soft tissue mobilization instrument assisted STM over bil lumbar paraspinals          Trigger Point Dry Needling - 04/04/15 1256    Consent Given? Yes   Education Handout Provided Yes   Muscles Treated Upper Body --  lumbar multifidus              PT Education - 04/04/15 1253    Education provided Yes   Education Details dry needling education   Person(s) Educated Patient   Methods Explanation   Comprehension Verbalized understanding          PT Short Term Goals - 03/28/15 0909    PT SHORT TERM GOAL #1   Title Patient will be independent in basic self care including postural correction when sitting, basic body mechanics with vacumning   Time 4   Period Weeks   Status On-going  PT SHORT TERM GOAL #2   Title Patient will report a 25% improvement in pain/function   Time 4   Period Weeks   Status On-going           PT Long Term Goals - 03/28/15 0909    PT LONG TERM GOAL #1   Title independent with HEP and safe self progression for further strengthening   Time 8   Period Weeks   Status On-going   PT LONG TERM GOAL #2   Title Bilateral hip and core strength 4+/5 needed for home and work duties   Time 8   Period Weeks   Status On-going   PT LONG TERM GOAL #3   Title Patient will report > 50% improvement in pain and decreased use of pain medication   Time 8   Period Weeks   Status On-going   PT LONG TERM GOAL #4   Title FOTO functional outcome score improved from 43% limitation to 33% indicating improved function with less pain   Time 8   Period Weeks   Status On-going               Plan - 04/04/15 1257     Clinical Impression Statement Vallarie reports some increased tightness in the low back today. with most of her soreness located at the R PSIS, upon further testing she demonstrates a postive forward flexion test with decrease anterior movement with signficant R hamstring tightness demonstrateing possible SI pathology. Follwoing R hip resisted hip flexion with anterior hip mobilization  she reported it felt better to bend forward.  She provided consent for dry needling for the lumbar multifiuds a twitch response was elicted and she reported relief follow with insrument assisted STM. Heat was applied to the low back and she rpeorted significant relief of tightness.    PT Next Visit Plan response to lumbar dry needling; core and hip strengthening;  assess carryover with lumbar extension trial/HEP; hip and soft tissue mobilization; heat and modalties as needed   Consulted and Agree with Plan of Care Patient        Problem List There are no active problems to display for this patient.  Lulu Riding PT, DPT, LAT, ATC  04/04/2015  1:04 PM      [facility] [address] [place], [place], [address] Phone: [phone]   Fax:  [phone]

## 2015-04-04 NOTE — Patient Instructions (Signed)
   Aengus Sauceda PT, DPT, LAT, ATC  [facility] Phone: [phone]     

## 2015-04-06 ENCOUNTER — Ambulatory Visit: Payer: 59 | Admitting: Physical Therapy

## 2015-04-06 DIAGNOSIS — M6281 Muscle weakness (generalized): Secondary | ICD-10-CM

## 2015-04-06 DIAGNOSIS — M25552 Pain in left hip: Secondary | ICD-10-CM

## 2015-04-06 DIAGNOSIS — M5441 Lumbago with sciatica, right side: Secondary | ICD-10-CM

## 2015-04-06 DIAGNOSIS — M5442 Lumbago with sciatica, left side: Secondary | ICD-10-CM

## 2015-04-06 NOTE — Therapy (Signed)
[facility] [address] [place], [place], [address] Phone: [phone]   Fax:  [phone]  Physical Therapy Treatment  Patient Details  Name: [name] MRN: [id number] Date of Birth: 1976/02/10 Referring Provider:  Maurice Small, MD  Encounter Date: 04/06/2015      PT End of Session - 04/06/15 0914    Visit Number 5   Number of Visits 16   Date for PT Re-Evaluation 05/16/15   Authorization Type UMR   PT Start Time 0830   PT Stop Time 0933   PT Time Calculation (min) 63 min   Activity Tolerance Patient tolerated treatment well   Behavior During Therapy [facility] for tasks assessed/performed      Past Medical History  Diagnosis Date  . Delivery by emergency caesarean section     Past Surgical History  Procedure Laterality Date  . Cesarean section       x 3 '04, '08, '10    There were no vitals filed for this visit.  Visit Diagnosis:  Bilateral low back pain with left-sided sciatica  Hip pain, left  Muscle weakness of lower extremity  Right-sided low back pain with right-sided sciatica      Subjective Assessment - 04/06/15 0836    Subjective Dry needling helped initially, pain has returned some.   Patient Stated Goals manage pain without meds   Currently in Pain? Yes   Pain Score 2    Pain Location Back   Pain Orientation Right;Left  L>R   Pain Descriptors / Indicators Aching;Sore   Pain Type Chronic pain   Pain Onset More than a month ago   Pain Frequency Intermittent                         [facility] Adult PT Treatment/Exercise - 04/06/15 0838    Lumbar Exercises: Stretches   Active Hamstring Stretch 4 reps;30 seconds  seated and supine   Active Hamstring Stretch Limitations supine with PNF contract/relax   Piriformis Stretch 2 reps;30 seconds   Piriformis Stretch Limitations bil   Lumbar Exercises: Aerobic   Stationary Bike NuStep x 8 min Level 5   Lumbar Exercises: Prone   Other Prone Lumbar Exercises prone multifidus isometrics 10 x 5 sec; mod cues for technique   Moist Heat Therapy   Number Minutes Moist Heat 10 Minutes   Moist Heat Location Lumbar Spine   Manual Therapy   Manual Therapy Joint mobilization;Soft tissue mobilization   Soft tissue mobilization STM over bil lumbar multifidi; L piriformis and L glut max          Trigger Point Dry Needling - 04/06/15 0925    Consent Given? Yes   Muscles Treated Lower Body Gluteus maximus;Piriformis  bil multifidi   Piriformis Response Twitch response elicited       Trigger Point Dry Needling performed by Lavinia Sharps, PT.           PT Short Term Goals - 03/28/15 6962    PT SHORT TERM GOAL #1   Title Patient will be independent in basic self care including postural correction when sitting, basic body mechanics with vacumning   Time 4   Period Weeks   Status On-going   PT SHORT TERM GOAL #2   Title Patient will report a 25% improvement in pain/function   Time 4   Period Weeks   Status On-going           PT Long Term  Goals - 03/28/15 0909    PT LONG TERM GOAL #1   Title independent with HEP and safe self progression for further strengthening   Time 8   Period Weeks   Status On-going   PT LONG TERM GOAL #2   Title Bilateral hip and core strength 4+/5 needed for home and work duties   Time 8   Period Weeks   Status On-going   PT LONG TERM GOAL #3   Title Patient will report > 50% improvement in pain and decreased use of pain medication   Time 8   Period Weeks   Status On-going   PT LONG TERM GOAL #4   Title FOTO functional outcome score improved from 43% limitation to 33% indicating improved function with less pain   Time 8   Period Weeks   Status On-going               Plan - 04/06/15 0915    Clinical Impression Statement Pt had good response to dry needling last session therefore performed again today.  Overall multifidi appeared with increased tissue extensibility  therefore dry needling performed on L piriformis, L glut max and bil lumbar multifidi.  Continuing to progress with PT and reports decreased tightness and pain overall.  Able to increase activity after last session.   PT Next Visit Plan response to lumbar dry needling; core and hip strengthening;  assess carryover with lumbar extension trial/HEP; hip and soft tissue mobilization; heat and modalties as needed   PT Home Exercise Plan core and SI stabilization        Problem List There are no active problems to display for this patient.  Clarita CraneStephanie F Lyza Houseworth, PT, DPT 04/06/2015 9:33 AM  [facility] [address] [place], [place], [address] Phone: [phone]   Fax:  [phone]

## 2015-04-12 ENCOUNTER — Ambulatory Visit: Payer: 59 | Admitting: Physical Therapy

## 2015-04-12 DIAGNOSIS — M25552 Pain in left hip: Secondary | ICD-10-CM

## 2015-04-12 DIAGNOSIS — M5441 Lumbago with sciatica, right side: Secondary | ICD-10-CM

## 2015-04-12 DIAGNOSIS — M6281 Muscle weakness (generalized): Secondary | ICD-10-CM

## 2015-04-12 DIAGNOSIS — M5442 Lumbago with sciatica, left side: Secondary | ICD-10-CM

## 2015-04-12 NOTE — Therapy (Signed)
[facility] [address] [place], [place], [address] Phone: [phone]   Fax:  [phone]  Physical Therapy Treatment  Patient Details  Name: [name] MRN: [id number] Date of Birth: 1975/10/31 No Data Recorded  Encounter Date: 04/12/2015      PT End of Session - 04/12/15 1051    Visit Number 6   Number of Visits 16   Date for PT Re-Evaluation 05/16/15   Authorization Type UMR   PT Start Time 1015   PT Stop Time 1107   PT Time Calculation (min) 52 min   Activity Tolerance Patient tolerated treatment well   Behavior During Therapy [facility] for tasks assessed/performed      Past Medical History  Diagnosis Date  . Delivery by emergency caesarean section     Past Surgical History  Procedure Laterality Date  . Cesarean section       x 3 '04, '08, '10    There were no vitals filed for this visit.  Visit Diagnosis:  Bilateral low back pain with left-sided sciatica  Hip pain, left  Muscle weakness of lower extremity  Right-sided low back pain with right-sided sciatica      Subjective Assessment - 04/12/15 1020    Subjective Feeling good; dry needling helped.  Last session had DN on L side, then noticed increased discomfort on R side.  Had a couple of mornings with some pain but overall much better.   Pertinent History good response with dry needling shoulder last year;     Limitations House hold activities   How long can you sit comfortably? 20 min   How long can you stand comfortably? bothers singing in church   How long can you walk comfortably? varies   Diagnostic tests none   Patient Stated Goals manage pain without meds   Currently in Pain? No/denies                         [facility] Adult PT Treatment/Exercise - 04/12/15 1022    Lumbar Exercises: Stretches   Passive Hamstring Stretch 3 reps;30 seconds   Passive Hamstring Stretch Limitations bil   Piriformis Stretch 3 reps;30 seconds    Piriformis Stretch Limitations bil   Lumbar Exercises: Aerobic   Stationary Bike NuStep x 8 min Level 5   Lumbar Exercises: Supine   Bridge 10 reps   Bridge Limitations with isometric hip abdct   Lumbar Exercises: Sidelying   Clam 15 reps   Clam Limitations green theraband   Lumbar Exercises: Prone   Other Prone Lumbar Exercises prone multifidus isometrics 10 x 5 sec; multifidus isometrics with hamstring curl x 10 bil   Lumbar Exercises: Quadruped   Straight Leg Raise 10 reps   Straight Leg Raises Limitations bil   Moist Heat Therapy   Number Minutes Moist Heat 15 Minutes   Moist Heat Location Lumbar Spine   Electrical Stimulation   Electrical Stimulation Location low back   Electrical Stimulation Action IFC   Electrical Stimulation Parameters to tolerance up to 12 mA   Electrical Stimulation Goals Pain                  PT Short Term Goals - 03/28/15 0909    PT SHORT TERM GOAL #1   Title Patient will be independent in basic self care including postural correction when sitting, basic body mechanics with vacumning   Time 4   Period Weeks   Status On-going  PT SHORT TERM GOAL #2   Title Patient will report a 25% improvement in pain/function   Time 4   Period Weeks   Status On-going           PT Long Term Goals - 03/28/15 0909    PT LONG TERM GOAL #1   Title independent with HEP and safe self progression for further strengthening   Time 8   Period Weeks   Status On-going   PT LONG TERM GOAL #2   Title Bilateral hip and core strength 4+/5 needed for home and work duties   Time 8   Period Weeks   Status On-going   PT LONG TERM GOAL #3   Title Patient will report > 50% improvement in pain and decreased use of pain medication   Time 8   Period Weeks   Status On-going   PT LONG TERM GOAL #4   Title FOTO functional outcome score improved from 43% limitation to 33% indicating improved function with less pain   Time 8   Period Weeks   Status On-going                Plan - 04/12/15 1052    Clinical Impression Statement Pt overall responding well to PT interventions, with some occasional pain in the morning.  Pt will conitnue to benefit from PT to maximize functional mobility and decrease pain.   PT Next Visit Plan response to lumbar dry needling; core and hip strengthening;  assess carryover with lumbar extension trial/HEP; hip and soft tissue mobilization; heat and modalties as needed   PT Home Exercise Plan core and SI stabilization   Consulted and Agree with Plan of Care Patient        Problem List There are no active problems to display for this patient.  Clarita CraneStephanie F Delois Silvester, PT, DPT 04/12/2015 11:10 AM  [facility] [address] [place], [place], [address] Phone: [phone]   Fax:  [phone]  Name: [name] MRN: [id number] Date of Birth: [birth year]

## 2015-04-14 ENCOUNTER — Ambulatory Visit: Payer: 59 | Admitting: Physical Therapy

## 2015-04-14 DIAGNOSIS — M5441 Lumbago with sciatica, right side: Secondary | ICD-10-CM

## 2015-04-14 DIAGNOSIS — M5442 Lumbago with sciatica, left side: Secondary | ICD-10-CM | POA: Diagnosis not present

## 2015-04-14 DIAGNOSIS — M25552 Pain in left hip: Secondary | ICD-10-CM

## 2015-04-14 DIAGNOSIS — M6281 Muscle weakness (generalized): Secondary | ICD-10-CM

## 2015-04-14 NOTE — Therapy (Signed)
[facility] [address] [place], [place], [address] Phone: [phone]   Fax:  [phone]  Physical Therapy Treatment  Patient Details  Name: [name] MRN: [id number] Date of Birth: [birth year] No Data Recorded  Encounter Date: 04/14/2015      PT End of Session - 04/14/15 1135    Visit Number 7   Number of Visits 16   Date for PT Re-Evaluation 05/16/15   Authorization Type UMR   PT Start Time 1100   PT Stop Time 1146   PT Time Calculation (min) 46 min   Activity Tolerance Patient tolerated treatment well   Behavior During Therapy [facility] for tasks assessed/performed      Past Medical History  Diagnosis Date  . Delivery by emergency caesarean section     Past Surgical History  Procedure Laterality Date  . Cesarean section       x 3 '04, '08, '10    There were no vitals filed for this visit.  Visit Diagnosis:  Bilateral low back pain with left-sided sciatica  Hip pain, left  Muscle weakness of lower extremity  Right-sided low back pain with right-sided sciatica      Subjective Assessment - 04/14/15 1102    Subjective More pain today; no pain in left leg but some pain in R leg today.  Was doing well until this morning.   Pertinent History good response with dry needling shoulder last year;     Patient Stated Goals manage pain without meds   Currently in Pain? Yes   Pain Score 4    Pain Location Back   Pain Orientation Left;Right;Lower;Medial   Pain Descriptors / Indicators Sore;Aching   Pain Type Chronic pain   Pain Radiating Towards RLE slightly   Pain Onset More than a month ago   Pain Frequency Intermittent   Aggravating Factors  prolonged sitting; bending forward   Pain Relieving Factors naperson, multifidus exercises                         [facility] Adult PT Treatment/Exercise - 04/14/15 1105    Lumbar Exercises: Stretches   Active Hamstring Stretch 3 reps;30 seconds   Active  Hamstring Stretch Limitations supine with PNF contract/relax   Passive Hamstring Stretch 3 reps;30 seconds   Passive Hamstring Stretch Limitations bil   Standing Side Bend 2 reps;30 seconds   Quadruped Mid Back Stretch 2 reps;30 seconds   ITB Stretch 3 reps;30 seconds   ITB Stretch Limitations bil   Lumbar Exercises: Aerobic   Elliptical Incline 6, Res 6 x 5 min   Modalities   Modalities Electrical Stimulation;Moist Heat   Moist Heat Therapy   Number Minutes Moist Heat 15 Minutes   Moist Heat Location Lumbar Spine   Electrical Stimulation   Electrical Stimulation Location low back   Electrical Stimulation Action IFC   Electrical Stimulation Parameters to tolerance up to 15mA   Electrical Stimulation Goals Pain                  PT Short Term Goals - 03/28/15 0909    PT SHORT TERM GOAL #1   Title Patient will be independent in basic self care including postural correction when sitting, basic body mechanics with vacumning   Time 4   Period Weeks   Status On-going   PT SHORT TERM GOAL #2   Title Patient will report a 25% improvement in pain/function   Time 4  Period Weeks   Status On-going           PT Long Term Goals - 03/28/15 0909    PT LONG TERM GOAL #1   Title independent with HEP and safe self progression for further strengthening   Time 8   Period Weeks   Status On-going   PT LONG TERM GOAL #2   Title Bilateral hip and core strength 4+/5 needed for home and work duties   Time 8   Period Weeks   Status On-going   PT LONG TERM GOAL #3   Title Patient will report > 50% improvement in pain and decreased use of pain medication   Time 8   Period Weeks   Status On-going   PT LONG TERM GOAL #4   Title FOTO functional outcome score improved from 43% limitation to 33% indicating improved function with less pain   Time 8   Period Weeks   Status On-going               Plan - 04/14/15 1135    Clinical Impression Statement Pt with return of pain  overnight, mentioned that she sat at computer with trunk slightly twisted for approx 30 min.  Feel this could have contributed to return of pain.  Will continue to benefit from PT to maximize function.   PT Next Visit Plan response to lumbar dry needling; core and hip strengthening;  assess carryover with lumbar extension trial/HEP; hip and soft tissue mobilization; heat and modalties as needed   PT Home Exercise Plan core and SI stabilization   Consulted and Agree with Plan of Care Patient        Problem List There are no active problems to display for this patient.  [name], PT, DPT 04/14/2015 11:48 AM  [facility] [address] [place], [place], [address] Phone: [phone]   Fax:  [phone]  Name: [name] MRN: [id number] Date of Birth: 1975/09/22

## 2015-04-19 ENCOUNTER — Ambulatory Visit: Payer: 59 | Admitting: Physical Therapy

## 2015-04-19 DIAGNOSIS — M5442 Lumbago with sciatica, left side: Secondary | ICD-10-CM | POA: Diagnosis not present

## 2015-04-19 DIAGNOSIS — M25511 Pain in right shoulder: Secondary | ICD-10-CM

## 2015-04-19 DIAGNOSIS — M5441 Lumbago with sciatica, right side: Secondary | ICD-10-CM

## 2015-04-19 DIAGNOSIS — M6281 Muscle weakness (generalized): Secondary | ICD-10-CM

## 2015-04-19 DIAGNOSIS — M25552 Pain in left hip: Secondary | ICD-10-CM

## 2015-04-19 NOTE — Patient Instructions (Signed)
   Namir Neto PT, DPT, LAT, ATC  [facility] Phone: [phone]     

## 2015-04-19 NOTE — Therapy (Signed)
[facility] [address] [place], [place], [address] Phone: [phone]   Fax:  [phone]  Physical Therapy Treatment  Patient Details  Name: Concha PyoJana L Handyside MRN: [id number] Date of Birth: 1976/01/06 No Data Recorded  Encounter Date: 04/19/2015      PT End of Session - 04/19/15 1237    Visit Number 8   Number of Visits 16   Date for PT Re-Evaluation 05/16/15   PT Start Time 1145   PT Stop Time 1245   PT Time Calculation (min) 60 min   Activity Tolerance Patient tolerated treatment well   Behavior During Therapy [facility] for tasks assessed/performed      Past Medical History  Diagnosis Date  . Delivery by emergency caesarean section     Past Surgical History  Procedure Laterality Date  . Cesarean section       x 3 '04, '08, '10    There were no vitals filed for this visit.  Visit Diagnosis:  Bilateral low back pain with left-sided sciatica  Hip pain, left  Muscle weakness of lower extremity  Right-sided low back pain with right-sided sciatica  Pain in joint, shoulder region, right      Subjective Assessment - 04/19/15 1152    Subjective "I feel like the th pain is getting better today and that I was a little sore following the needling"   Currently in Pain? Yes   Pain Score 2    Pain Location Back   Pain Orientation Right;Left;Medial   Pain Descriptors / Indicators --  feels like it could spasm   Pain Type Chronic pain   Pain Onset More than a month ago   Pain Frequency Intermittent                         [facility] Adult PT Treatment/Exercise - 04/19/15 0001    Lumbar Exercises: Stretches   Active Hamstring Stretch 1 rep;30 seconds   ITB Stretch 2 reps;30 seconds   Piriformis Stretch 2 reps;30 seconds   Lumbar Exercises: Aerobic   Stationary Bike NuStep x 8 min Level 5   Lumbar Exercises: Supine   Bridge 10 reps   Lumbar Exercises: Sidelying   Clam 15 reps   Moist Heat Therapy   Number Minutes Moist Heat 10 Minutes   Moist Heat Location Lumbar Spine  in supine with feet elevated   Manual Therapy   Soft tissue mobilization STM over bil lumbar multifidi; L piriformis and L glut max          Trigger Point Dry Needling - 04/19/15 1154    Consent Given? Yes   Muscles Treated Lower Body Gluteus maximus;Gluteus minimus  L lumbar multifidus   Gluteus Maximus Response Twitch response elicited;Palpable increased muscle length   Gluteus Minimus Response Twitch response elicited;Palpable increased muscle length   Piriformis Response Twitch response elicited;Palpable increased muscle length              PT Education - 04/19/15 1237    Education provided Yes   Education Details updated HEP   Person(s) Educated Patient   Methods Explanation   Comprehension Verbalized understanding          PT Short Term Goals - 04/19/15 1239    PT SHORT TERM GOAL #1   Title Patient will be independent in basic self care including postural correction when sitting, basic body mechanics with vacumning   Time 4   Period Weeks   Status  On-going   PT SHORT TERM GOAL #2   Title Patient will report a 25% improvement in pain/function   Period Weeks   Status On-going           PT Long Term Goals - 04/19/15 1240    PT LONG TERM GOAL #1   Title independent with HEP and safe self progression for further strengthening   Time 8   Period Weeks   Status On-going   PT LONG TERM GOAL #2   Title Bilateral hip and core strength 4+/5 needed for home and work duties   Time 8   Period Weeks   Status On-going   PT LONG TERM GOAL #3   Title Patient will report > 50% improvement in pain and decreased use of pain medication   Time 8   Period Weeks   Status On-going   PT LONG TERM GOAL #4   Title FOTO functional outcome score improved from 43% limitation to 33% indicating improved function with less pain   Time 8   Period Weeks   Status On-going               Plan -  04/19/15 1237    Clinical Impression Statement Lynsey reports that she has decreased pain and tightness since the last visit with some intermittent pain inthe L lower lumbar region. she provided consent for dry needling of the L lumbar multifuds and L glute medius/minimus/ maximius and piriformis which multpl twitches were palpated and lengthening was noted. She rpeorted some tightness during the rest of the exercies but stated she felt like it was looser.    PT Next Visit Plan 4 core and hip strengthening;  assess carryover with lumbar extension trial/HEP; hip and soft tissue mobilization; heat and modalties as needed, dry needling   PT Home Exercise Plan standing/ laying IT band stretching   Consulted and Agree with Plan of Care Patient        Problem List There are no active problems to display for this patient.  Lulu Riding PT, DPT, LAT, ATC  04/19/2015  12:44 PM      [facility] [address] [place], [place], [address] Phone: [phone]   Fax:  [phone]  Name: ADANYA SOSINSKI MRN: [id number] Date of Birth: [birth year]

## 2015-04-25 ENCOUNTER — Ambulatory Visit: Payer: 59 | Admitting: Physical Therapy

## 2015-04-28 ENCOUNTER — Ambulatory Visit: Payer: 59 | Attending: Family Medicine | Admitting: Physical Therapy

## 2015-04-28 DIAGNOSIS — M6281 Muscle weakness (generalized): Secondary | ICD-10-CM | POA: Diagnosis present

## 2015-04-28 DIAGNOSIS — M5442 Lumbago with sciatica, left side: Secondary | ICD-10-CM | POA: Insufficient documentation

## 2015-04-28 DIAGNOSIS — M25552 Pain in left hip: Secondary | ICD-10-CM | POA: Diagnosis present

## 2015-04-28 DIAGNOSIS — M25511 Pain in right shoulder: Secondary | ICD-10-CM | POA: Diagnosis present

## 2015-04-28 DIAGNOSIS — M5441 Lumbago with sciatica, right side: Secondary | ICD-10-CM | POA: Diagnosis present

## 2015-04-28 NOTE — Therapy (Signed)
[facility] [address] [place], [place], [address] Phone: [phone]   Fax:  [phone]  Physical Therapy Treatment  Patient Details  Name: Cynthia PyoJana L [place]street MRN: [id number] Date of Birth: [birth year] No Data Recorded  Encounter Date: 04/28/2015      PT End of Session - 04/28/15 1019    Visit Number 9   Number of Visits 16   Date for PT Re-Evaluation 05/16/15   Authorization Type UMR   PT Start Time 0930   PT Stop Time 1023   PT Time Calculation (min) 53 min   Activity Tolerance Patient tolerated treatment well      Past Medical History  Diagnosis Date  . Delivery by emergency caesarean section     Past Surgical History  Procedure Laterality Date  . Cesarean section       x 3 '04, '08, '10    There were no vitals filed for this visit.  Visit Diagnosis:  Bilateral low back pain with left-sided sciatica  Hip pain, left  Muscle weakness of lower extremity  Right-sided low back pain with right-sided sciatica  Pain in joint, shoulder region, right      Subjective Assessment - 04/28/15 0932    Subjective (p) Pretty sore after last time but felt better later.  Right HS region discomfort after riding in the car.  Reports she is stretching often.  Some mild lumbosacral and right gluteal region.    Currently in Pain? (p) Yes   Pain Score (p) 1    Pain Location (p) Buttocks   Pain Orientation (p) Right;Left   Pain Type (p) Chronic pain   Aggravating Factors  (p) prolonged [place] [facility] [place] [place]                         [facility] Adult PT Treatment/Exercise - 04/28/15 1015    Lumbar Exercises: Stretches   Piriformis Stretch 3 reps;20 seconds  seated, supine and quadruped   Lumbar Exercises: Supine   Ab Set 10 reps   Bent Knee Raise 10 reps   Isometric Hip Flexion 10 reps   Moist Heat Therapy   Number Minutes Moist Heat 10 Minutes   Moist Heat Location Lumbar Spine   Manual  Therapy   Soft tissue mobilization STM over bil lumbar multifidi; B piriformis and L glut max          Trigger Point Dry Needling - 04/28/15 1017    Consent Given? Yes   Muscles Treated Lower Body Gluteus maximus;Piriformis  B lumbar multifidi   Gluteus Maximus Response Palpable increased muscle length   Gluteus Minimus Response Palpable increased muscle length   Piriformis Response Palpable increased muscle length      Performed bilaterally        PT Education - 04/28/15 1018    Education provided Yes   Education Details transverse abdominus supine series per handout;  piriformis stretching;  lumbar roll recommended for car   Person(s) Educated Patient   Methods Explanation;Demonstration;Handout   Comprehension Verbalized understanding;Returned demonstration          PT Short Term Goals - 04/28/15 1116    PT SHORT TERM GOAL #1   Title Patient will be independent in basic self care including postural correction when sitting, basic body mechanics with vacumning   Time 4   Period Weeks   Status Achieved   PT SHORT TERM GOAL #2   Title Patient will report a  25% improvement in pain/function   Status Achieved           PT Long Term Goals - 04/28/15 1117    PT LONG TERM GOAL #1   Title independent with HEP and safe self progression for further strengthening   Period Weeks   Status On-going   PT LONG TERM GOAL #2   Title Bilateral hip and core strength 4+/5 needed for home and work duties   Time 8   Period Weeks   Status On-going   PT LONG TERM GOAL #3   Title Patient will report > 50% improvement in pain and decreased use of pain medication   Time 8   Period Weeks   Status On-going   PT LONG TERM GOAL #4   Title FOTO functional outcome score improved from 43% limitation to 33% indicating improved function with less pain   Time 8   Period Weeks   Status On-going               Plan - 04/28/15 1020    Clinical Impression Statement Improved muscle  length and trigger point size and number.  With instruction, she is able to activate transverse abdominals needed for stabilization with ADLs.  Main complaint is discomfort with long commute to/from work.  Recommend lumbar support for car seat.  Progressing toward LTGs.  Anticipate discharge in 2-3 weeks.     PT Next Visit Plan 4 core and hip strengthening;  assess carryover abdominal brace HEP and new piriformis stretches;  hip and soft tissue mobilization; heat and modalties as needed, dry needling;  recheck hip MMT and LTGs.        Problem List There are no active problems to display for this patient.   Vivien Presto 04/28/2015, 11:20 AM  [facility] [address] [place], [place], [address] Phone: [phone]   Fax:  [phone]  Name: LAHELA WOODIN MRN: [id number] Date of Birth: [birth year]  Lavinia Sharps, PT 04/28/2015 11:20 AM Phone: [phone] Fax: [phone]

## 2015-05-01 ENCOUNTER — Ambulatory Visit: Payer: 59 | Admitting: Physical Therapy

## 2015-05-01 DIAGNOSIS — M6281 Muscle weakness (generalized): Secondary | ICD-10-CM

## 2015-05-01 DIAGNOSIS — M5442 Lumbago with sciatica, left side: Secondary | ICD-10-CM

## 2015-05-01 DIAGNOSIS — M5441 Lumbago with sciatica, right side: Secondary | ICD-10-CM

## 2015-05-01 DIAGNOSIS — M25552 Pain in left hip: Secondary | ICD-10-CM

## 2015-05-01 DIAGNOSIS — M25511 Pain in right shoulder: Secondary | ICD-10-CM

## 2015-05-01 NOTE — Therapy (Signed)
[facility] [address] [place], [place], [address] Phone: [phone]   Fax:  [phone]  Physical Therapy Treatment  Patient Details  Name: [name] MRN: [id number] Date of Birth: 13-Feb-1976 No Data Recorded  Encounter Date: 05/01/2015      PT End of Session - 05/01/15 1414    Visit Number 10   Number of Visits 16   Date for PT Re-Evaluation 05/16/15   Authorization Type UMR   PT Start Time 1330   PT Stop Time 1425   PT Time Calculation (min) 55 min   Activity Tolerance Patient tolerated treatment well   Behavior During Therapy [facility] for tasks assessed/performed      Past Medical History  Diagnosis Date  . Delivery by emergency caesarean section     Past Surgical History  Procedure Laterality Date  . Cesarean section       x 3 '04, '08, '10    There were no vitals filed for this visit.  Visit Diagnosis:  Bilateral low back pain with left-sided sciatica  Hip pain, left  Muscle weakness of lower extremity  Right-sided low back pain with right-sided sciatica  Pain in joint, shoulder region, right      Subjective Assessment - 05/01/15 1326    Subjective "I feeling sore this morning but after moving around walking I felt better   Currently in Pain? No/denies   Pain Score 0-No pain   Pain Location Back   Pain Orientation Right;Left;Medial   Pain Descriptors / Indicators Aching   Pain Type Chronic pain   Pain Onset More than a month ago   Pain Frequency Intermittent   Aggravating Factors  stepping out of the car from the drivers side, and reaching forward                         [facility] Adult PT Treatment/Exercise - 05/01/15 0001    Self-Care   Self-Care Other Self-Care Comments   Other Self-Care Comments  proper palaption for transverse abdominis during abdominal draw manuever exercsies and progression.    Lumbar Exercises: Stretches   Active Hamstring Stretch 5 reps;30 seconds   PNF contract relax x 10 sec   Piriformis Stretch 2 reps;30 seconds   Lumbar Exercises: Aerobic   Stationary Bike NuStep L 5 x 5 min   Lumbar Exercises: Supine   Bent Knee Raise 20 reps  4#   Bent Knee Raise Limitations VC for transverse abdominus draw in manuever   Bridge 10 reps  x 2 sets, 1 set with alternating kickouts with 4#   Straight Leg Raise 10 reps;1 second  VC for quad set during SLR   Other Supine Lumbar Exercises dead bug 3 x 10 sec   Lumbar Exercises: Prone   Other Prone Lumbar Exercises prone multifidus isometrics 10 x 5 sec; multifidus isometrics with hamstring curl x 10 bil   Lumbar Exercises: Quadruped   Straight Leg Raise 10 reps   Opposite Arm/Leg Raise Right arm/Left leg;Left arm/Right leg;15 reps   Plank 4 x 8 sec hold   Moist Heat Therapy   Number Minutes Moist Heat 10 Minutes   Moist Heat Location Lumbar Spine with pt in supine.                 PT Education - 05/01/15 1409    Education provided Yes   Education Details updated HEP   Person(s) Educated Patient   Methods Explanation  Comprehension Verbalized understanding          PT Short Term Goals - 04/28/15 1116    PT SHORT TERM GOAL #1   Title Patient will be independent in basic self care including postural correction when sitting, basic body mechanics with vacumning   Time 4   Period Weeks   Status Achieved   PT SHORT TERM GOAL #2   Title Patient will report a 25% improvement in pain/function   Status Achieved           PT Long Term Goals - 04/28/15 1117    PT LONG TERM GOAL #1   Title independent with HEP and safe self progression for further strengthening   Period Weeks   Status On-going   PT LONG TERM GOAL #2   Title Bilateral hip and core strength 4+/5 needed for home and work duties   Time 8   Period Weeks   Status On-going   PT LONG TERM GOAL #3   Title Patient will report > 50% improvement in pain and decreased use of pain medication   Time 8   Period Weeks    Status On-going   PT LONG TERM GOAL #4   Title FOTO functional outcome score improved from 43% limitation to 33% indicating improved function with less pain   Time 8   Period Weeks   Status On-going               Plan - 05/01/15 1415    Clinical Impression Statement Dyke MaesJana continues to make great progress and reports no pain today. She did state she had pain earlier in the morning but it went away with walking and doing her errands. opted to hold off on the dry needling and manual work to assess pt's progress with therapy. pt performed all exercises today with minimal complaint of pain reported as muscl soreness. plan to assess how todays session went next visit.    PT Next Visit Plan 4 core and hip strengthening;  assess carryover abdominal brace HEP and new piriformis stretches;  hip and soft tissue mobilization; heat and modalties as needed, dry needling;  recheck hip MMT and LTGs.   PT Home Exercise Plan plank exercise   Consulted and Agree with Plan of Care Patient        Problem List There are no active problems to display for this patient.  Lulu RidingKristoffer Korene Dula PT, DPT, LAT, ATC  05/01/2015  2:44 PM   [facility] [address] [place], [place], [address] Phone: [phone]   Fax:  [phone]  Name: Concha PyoJana L Sarria MRN: [id number] Date of Birth: May 09, 1976

## 2015-05-03 ENCOUNTER — Encounter: Payer: Self-pay | Admitting: Physical Therapy

## 2015-05-08 ENCOUNTER — Ambulatory Visit: Payer: 59 | Admitting: Physical Therapy

## 2015-05-08 DIAGNOSIS — M5442 Lumbago with sciatica, left side: Secondary | ICD-10-CM

## 2015-05-08 DIAGNOSIS — M25552 Pain in left hip: Secondary | ICD-10-CM

## 2015-05-08 DIAGNOSIS — M5441 Lumbago with sciatica, right side: Secondary | ICD-10-CM

## 2015-05-08 DIAGNOSIS — M25511 Pain in right shoulder: Secondary | ICD-10-CM

## 2015-05-08 DIAGNOSIS — M6281 Muscle weakness (generalized): Secondary | ICD-10-CM

## 2015-05-08 NOTE — Therapy (Signed)
[facility] Outpatient [facility] [address] [place], [place], [address] Phone: [phone]   Fax:  [phone]  Physical Therapy Treatment  Patient Details  Name: [name] MRN: [id number] Date of Birth: 1976/04/29 No Data Recorded  Encounter Date: 05/08/2015      PT End of Session - 05/08/15 1231    Visit Number 11   Number of Visits 16   Date for PT Re-Evaluation 05/16/15   Authorization Type UMR   PT Start Time 1100   PT Stop Time 1145   PT Time Calculation (min) 45 min   Activity Tolerance Patient tolerated treatment well   Behavior During Therapy [facility] for tasks assessed/performed      Past Medical History  Diagnosis Date  . Delivery by emergency caesarean section     Past Surgical History  Procedure Laterality Date  . Cesarean section       x 3 '04, '08, '10    There were no vitals filed for this visit.  Visit Diagnosis:  Bilateral low back pain with left-sided sciatica  Hip pain, left  Muscle weakness of lower extremity  Right-sided low back pain with right-sided sciatica  Pain in joint, shoulder region, right      Subjective Assessment - 05/08/15 1102    Subjective "I had a lot of soreness on Saturday after cleaning on Friday and I had to take some medication and it really helped and today have 1/10 pain in the left side" pt reports she is able to sit and stand longer   Currently in Pain? Yes   Pain Score 1    Pain Location Back   Pain Orientation Right;Left;Medial   Pain Descriptors / Indicators Aching   Pain Type Chronic pain   Pain Onset More than a month ago   Pain Frequency Intermittent   Aggravating Factors  prlonged cleaning,    Pain Relieving Factors naperson, multifidus exercises                         [facility] Adult PT Treatment/Exercise - 05/08/15 1109    Lumbar Exercises: Stretches   ITB Stretch 2 reps;30 seconds   Piriformis Stretch 2 reps;30 seconds   Lumbar Exercises:  Aerobic   Elliptical Incline 2, L 2 x 5 min   Lumbar Exercises: Machines for Strengthening   Leg Press 2 plates 1 x 10, 3 plates 2 x 10,    Other Lumbar Machine Exercise standing hip cybex hip abduction and extension bil 1 plate x 15 bil   Lumbar Exercises: Prone   Other Prone Lumbar Exercises prone multifidus isometrics 10 x 5 sec; multifidus isometrics with hamstring curl x 10 bil   1/2 kneeling on airex pad in tandem with cross body reaching and placing cones 2 x 8 performed bil.     Supine: Laying on bolster with arms up doing marching 2 x 20 : VC for keeping core tight    Lumbar Exercises: Quadruped   Opposite Arm/Leg Raise Right arm/Left leg;Left arm/Right leg;15 reps   Plank 3 x 15 sec hold                  PT Education - 05/08/15 1231    Education provided Yes   Education Details updated core strengthening HEP   Person(s) Educated Patient   Methods Explanation   Comprehension Verbalized understanding          PT Short Term Goals - 04/28/15 1116  PT SHORT TERM GOAL #1   Title Patient will be independent in basic self care including postural correction when sitting, basic body mechanics with vacumning   Time 4   Period Weeks   Status Achieved   PT SHORT TERM GOAL #2   Title Patient will report a 25% improvement in pain/function   Status Achieved           PT Long Term Goals - 04/28/15 1117    PT LONG TERM GOAL #1   Title independent with HEP and safe self progression for further strengthening   Period Weeks   Status On-going   PT LONG TERM GOAL #2   Title Bilateral hip and core strength 4+/5 needed for home and work duties   Time 8   Period Weeks   Status On-going   PT LONG TERM GOAL #3   Title Patient will report > 50% improvement in pain and decreased use of pain medication   Time 8   Period Weeks   Status On-going   PT LONG TERM GOAL #4   Title FOTO functional outcome score improved from 43% limitation to 33% indicating improved function  with less pain   Time 8   Period Weeks   Status On-going               Plan - 05/08/15 1232    Clinical Impression Statement [name] reported some soreness on Saturday following cleaning on Friday but that it was gone after taking medication and continued to report 0-1/10 today. Due to decreaed pain focused todays treatment on hip and core strengthening which she reported intermittent soreness in the back that was fleeting after she finished exercises.  Discussed with pt to discuss at next visit how she felt today due to doing more execies with no manual STW.  pt  reported that she didnt need MHP due to 0/10 pain at end or therapy today.    PT Next Visit Plan 4 core and hip strengthening;  hip and soft tissue mobilization PRN; heat and modalties prn, dry needling PRN;  recheck hip MMT and LTGs        Problem List There are no active problems to display for this patient.  [name] PT, DPT, LAT, ATC  05/08/2015  12:40 PM    [facility] [address] [place], [place], [address] Phone: [phone]   Fax:  [phone]  Name: [name] MRN: [id number] Date of Birth: 03/14/1976

## 2015-05-08 NOTE — Patient Instructions (Signed)
   Shanise Balch PT, DPT, LAT, ATC  [facility] Phone: [phone]     

## 2015-05-11 ENCOUNTER — Ambulatory Visit: Payer: 59 | Admitting: Physical Therapy

## 2015-05-11 DIAGNOSIS — M5441 Lumbago with sciatica, right side: Secondary | ICD-10-CM

## 2015-05-11 DIAGNOSIS — M5442 Lumbago with sciatica, left side: Secondary | ICD-10-CM

## 2015-05-11 DIAGNOSIS — M6281 Muscle weakness (generalized): Secondary | ICD-10-CM

## 2015-05-11 DIAGNOSIS — M25552 Pain in left hip: Secondary | ICD-10-CM

## 2015-05-11 NOTE — Therapy (Signed)
[place], [place], [address] Phone: [phone]   Fax:  [phone]  Physical Therapy Treatment  Patient Details  Name: [name] MRN: [id number] Date of Birth: 12-30-1975 No Data Recorded  Encounter Date: 05/11/2015      PT End of Session - 05/11/15 1246    Visit Number 12   Number of Visits 16   Date for PT Re-Evaluation 05/16/15   Authorization Type UMR   PT Start Time 1100   PT Stop Time 1150   PT Time Calculation (min) 50 min   Activity Tolerance Patient tolerated treatment well      Past Medical History  Diagnosis Date  . Delivery by emergency caesarean section     Past Surgical History  Procedure Laterality Date  . Cesarean section       x 3 '04, '08, '10    There were no vitals filed for this visit.  Visit Diagnosis:  Bilateral low back pain with left-sided sciatica  Hip pain, left  Muscle weakness of lower extremity  Right-sided low back pain with right-sided sciatica      Subjective Assessment - 05/11/15 1058    Subjective Reports she did well Tuesday and Wed.  Did some chores yesterday and some central low back pain.  Less LE pain with driving and sitting   Currently in Pain? Yes   Pain Score 2    Pain Location Back   Pain Orientation Mid;Right   Aggravating Factors  housework;                           [facility] Adult PT Treatment/Exercise - 05/11/15 1110    Lumbar Exercises: Stretches   Active Hamstring Stretch 3 reps;30 seconds   Lumbar Exercises: Aerobic   Stationary Bike Nu-Step L 5 6 min   Lumbar Exercises: Machines for Strengthening   Leg Press  Standing single leg with UE green band diagonals 15x R/L  Standing hip abduction and extension 15x right /left   2 plates 1 x 10, 3 plates 2 x 10,    Lumbar Exercises: Prone   Other Prone Lumbar Exercises prone multifidus isometrics 10 x 5 sec; multifidus isometrics with hamstring curl x 10  bil   Lumbar Exercises: Quadruped   Opposite Arm/Leg Raise Right arm/Left leg;Left arm/Right leg;15 reps   Plank 3 x 15 sec hold                PT Education - 05/11/15 1246    Education provided Yes   Education Details hip strengthening with band   Person(s) Educated Patient   Methods Explanation;Demonstration;Handout   Comprehension Verbalized understanding;Returned demonstration          PT Short Term Goals - 05/11/15 1250    PT SHORT TERM GOAL #1   Title Patient will be independent in basic self care including postural correction when sitting, basic body mechanics with vacumning   Status Achieved   PT SHORT TERM GOAL #2   Title Patient will report a 25% improvement in pain/function   Status Achieved           PT Long Term Goals - 05/11/15 1250    PT LONG TERM GOAL #1   Title independent with HEP and safe self progression for further strengthening   Time 8   Period Weeks   Status Partially Met   PT LONG TERM GOAL #2   Title Bilateral hip  and core strength 4+/5 needed for home and work duties   Time 8   Period Weeks   Status On-going   PT LONG TERM GOAL #3   Title Patient will report > 50% improvement in pain and decreased use of pain medication   Time 8   Period Weeks   Status On-going   PT LONG TERM GOAL #4   Title FOTO functional outcome score improved from 43% limitation to 33% indicating improved function with less pain   Time 8   Period Weeks   Status On-going               Plan - 05/11/15 1246    Clinical Impression Statement The patient reports she is doing much better overall.  She has some mild LBP today which she attributes to housework and not stretching yesterday.  Improving muscle length as well as hip/core strength.  Therapist closely monitoring pain response.  She reports decreased pain post session and declines the need for manual interventions or modalities.     PT Next Visit Plan Check progress toward goals and MMT hips next  visit.  FOTO.  May be ready for discharge if majority of goals met.        Problem List There are no active problems to display for this patient. Ruben Im, PT 05/11/2015 12:53 PM Phone: [phone] Fax: [phone]  [name] 05/11/2015, 12:52 PM  [facility] [address] [place], [place], [address] Phone: [phone]   Fax:  [phone]  Name: [name] MRN: [id number] Date of Birth: 1975-09-19

## 2015-05-16 ENCOUNTER — Ambulatory Visit: Payer: 59 | Admitting: Physical Therapy

## 2015-05-16 DIAGNOSIS — M5442 Lumbago with sciatica, left side: Secondary | ICD-10-CM

## 2015-05-16 DIAGNOSIS — M5441 Lumbago with sciatica, right side: Secondary | ICD-10-CM

## 2015-05-16 DIAGNOSIS — M25552 Pain in left hip: Secondary | ICD-10-CM

## 2015-05-16 DIAGNOSIS — M6281 Muscle weakness (generalized): Secondary | ICD-10-CM

## 2015-05-16 NOTE — Therapy (Signed)
[place], [place], [address] Phone: [phone]   Fax:  [phone]  Physical Therapy Treatment  Patient Details  Name: [name] MRN: [id number] Date of Birth: March 07, 1976 No Data Recorded  Encounter Date: 05/16/2015      PT End of Session - 05/16/15 1536    Visit Number 13   Number of Visits 16   PT Start Time 1332   PT Stop Time 2197   PT Time Calculation (min) 43 min   Activity Tolerance Patient tolerated treatment well;No increased pain   Behavior During Therapy [facility] for tasks assessed/performed      Past Medical History  Diagnosis Date  . Delivery by emergency caesarean section     Past Surgical History  Procedure Laterality Date  . Cesarean section       x 3 '04, '08, '10    There were no vitals filed for this visit.  Visit Diagnosis:  Bilateral low back pain with left-sided sciatica  Hip pain, left  Muscle weakness of lower extremity  Right-sided low back pain with right-sided sciatica      Subjective Assessment - 05/16/15 1531    Subjective Sat had pain 3/10.  May have been sitting at the computer .  No pain right now   Currently in Pain? No/denies   Pain Score 3   On Saturday   Pain Location Back   Pain Orientation Mid   Pain Descriptors / Indicators Aching;Tightness   Pain Type Chronic pain   Pain Radiating Towards none   Pain Frequency Intermittent   Aggravating Factors  sitting too long   Pain Relieving Factors medication., exercise did not work                         [facility] Adult PT Treatment/Exercise - 05/16/15 1335    Self-Care   Other Self-Care Comments  Lifting practiced 5 LBs, Handout issued and reviewed with PTA demonstration techniques and modifications.  Sitting posture seems to be one of the most challanging positions for her to get comfortable, she has tried rolls, supports and they are never quite right.  Discussed the need for  exercising on a regular basis.  as a busy Mom she finds the most time for stretching.  Problem solved what to do if she had pain increase.     Lumbar Exercises: Supine   Other Supine Lumbar Exercises MMT: Flexion 4+/5 RT, LT 5/5, Extension RT 5/5, LT 4+/5, Abduction RT 5/5, LT 4+/5, Adduction LT 3/5, RT 4/5   Shoulder Exercises: Stretch   Other Shoulder Stretches scapular squeeze with ER at shoulders.                  PT Education - 05/16/15 1352    Education provided Yes   Education Details ADL, hip add(Verbal instruction)          PT Short Term Goals - 05/11/15 1250    PT SHORT TERM GOAL #1   Title Patient will be independent in basic self care including postural correction when sitting, basic body mechanics with vacumning   Status Achieved   PT SHORT TERM GOAL #2   Title Patient will report a 25% improvement in pain/function   Status Achieved           PT Long Term Goals - 05/16/15 1547    PT LONG TERM GOAL #1   Title independent with HEP and safe self progression  for further strengthening   Time 8   Period Weeks   Status --   PT LONG TERM GOAL #2   Title Bilateral hip and core strength 4+/5 needed for home and work duties   Baseline 3 to 5/5   Time 8   Period Weeks   Status On-going   PT LONG TERM GOAL #3   Title Patient will report > 50% improvement in pain and decreased use of pain medication   Baseline 60%   Time 8   Period Weeks   Status Achieved   PT LONG TERM GOAL #4   Title FOTO functional outcome score improved from 43% limitation to 33% indicating improved function with less pain   Baseline 39 % today   Time 8   Period Weeks   Status On-going               Plan - 05/16/15 1538    Clinical Impression Statement Lifting 5 LBS reported back strain , better when engaging abdominals.  Goals checked,  All goals met except for strength goals.  She feels she could benifit more supervised strengthening 1 X a week with [place] vs doing it at home.   She has pain flares 2 X a week.  I told her she will need to discuss the plan to continue PT with her PT.  Just to get her on the schedule she made 1 X a week appointments which I explained to her it was up to her PT and schedule might change. FOTO 39% limitation  vs intake of 43%.    PT Next Visit Plan Renewal vs D/C per patient request with decreased frequency, per patient's request.  Focus on strengthening.   PT Home Exercise Plan Try to use good posture. Stop immediately and stretch or change position if pain increaseds.   Consulted and Agree with Plan of Care Patient        Problem List There are no active problems to display for this patient.   [facility] 05/16/2015, 3:58 PM  [facility] [address] [place], [place], [address] Phone: [phone]   Fax:  [phone]  Name: [name] MRN: [id number] Date of Birth: 02/02/1976    Melvenia Needles, PTA 05/16/2015 3:58 PM Phone: [phone] Fax: [phone]

## 2015-05-16 NOTE — Patient Instructions (Addendum)

## 2015-05-25 ENCOUNTER — Ambulatory Visit: Payer: 59 | Attending: Family Medicine | Admitting: Physical Therapy

## 2015-05-25 DIAGNOSIS — M5441 Lumbago with sciatica, right side: Secondary | ICD-10-CM | POA: Insufficient documentation

## 2015-05-25 DIAGNOSIS — M25552 Pain in left hip: Secondary | ICD-10-CM | POA: Insufficient documentation

## 2015-05-25 DIAGNOSIS — M5442 Lumbago with sciatica, left side: Secondary | ICD-10-CM | POA: Diagnosis not present

## 2015-05-25 DIAGNOSIS — M6281 Muscle weakness (generalized): Secondary | ICD-10-CM | POA: Insufficient documentation

## 2015-05-25 NOTE — Therapy (Signed)
[place], [place], [address] Phone: [phone]   Fax:  [phone]  Physical Therapy Treatment/Recertification  Patient Details  Name: [name] MRN: [id number] Date of Birth: [birth year] No Data Recorded  Encounter Date: 05/25/2015      PT End of Session - 05/25/15 1110    Visit Number 14   Number of Visits 22   Date for PT Re-Evaluation 06/22/15   Authorization Type UMR   PT Start Time 1027   PT Stop Time 1112   PT Time Calculation (min) 45 min   Activity Tolerance Patient tolerated treatment well      Past Medical History  Diagnosis Date  . Delivery by emergency caesarean section     Past Surgical History  Procedure Laterality Date  . Cesarean section       x 3 '04, '08, '10    There were no vitals filed for this visit.  Visit Diagnosis:  Bilateral low back pain with left-sided sciatica - Plan: PT plan of care cert/re-cert  Hip pain, left - Plan: PT plan of care cert/re-cert  Muscle weakness of lower extremity - Plan: PT plan of care cert/re-cert  Right-sided low back pain with right-sided sciatica - Plan: PT plan of care cert/re-cert      Subjective Assessment - 05/25/15 1031    Subjective Reports increased right > left buttock pain and down posterior right thigh.  Especially hurts while sitting.  " I don't know what to do to make it go away."   Currently in Pain? Yes   Pain Score 3    Pain Location Buttocks   Pain Orientation Right   Pain Onset More than a month ago   Pain Frequency Intermittent   Aggravating Factors  sitting too long            [facility] PT Assessment - 05/25/15 1106    Observation/Other Assessments   Focus on Therapeutic Outcomes (FOTO)  43% limitation   AROM   Right Hip External Rotation  45   Right Hip Internal Rotation  35   Strength   Right Hip ABduction 4+/5   Lumbar Flexion 4/5   Lumbar Extension 4/5   Palpation   Palpation comment  Tender R/L gluteals and piriformis                     [facility] Adult PT Treatment/Exercise - 05/25/15 1102    Lumbar Exercises: Stretches   Piriformis Stretch --  review of HEP   Moist Heat Therapy   Number Minutes Moist Heat 10 Minutes   Moist Heat Location Lumbar Spine;Hip   Manual Therapy   Soft tissue mobilization STM over bil lumbar multifidi; B piriformis and L glut max          Trigger Point Dry Needling - 05/25/15 1103    Consent Given? Yes   Muscles Treated Lower Body --  B lumbar multifidi   Gluteus Maximus Response Twitch response elicited;Palpable increased muscle length   Gluteus Minimus Response Twitch response elicited;Palpable increased muscle length   Piriformis Response Palpable increased muscle length      performed bilaterally          PT Education - 05/25/15 1109    Education provided Yes   Education Details Strategies to control right LE radiating pain   Person(s) Educated Patient   Methods Explanation;Demonstration;Handout   Comprehension Verbalized understanding;Returned demonstration          PT  Short Term Goals - 05/25/15 1120    PT SHORT TERM GOAL #1   Title Patient will be independent in basic self care including postural correction when sitting, basic body mechanics with vacumning   Status Achieved   PT SHORT TERM GOAL #2   Title Patient will report a 25% improvement in pain/function   Status Achieved           PT Long Term Goals - 05/25/15 1120    PT LONG TERM GOAL #1   Title independent with HEP and safe self progression for further strengthening   Time 8   Period Weeks   Status Partially Met   PT LONG TERM GOAL #2   Title Bilateral hip and core strength 4+/5 needed for home and work duties   Time 8   Period Weeks   Status On-going   PT LONG TERM GOAL #3   Title Patient will report > 50% improvement in pain and decreased use of pain medication   Status Achieved   PT LONG TERM GOAL #4   Title FOTO  functional outcome score improved from 43% limitation to 33% indicating improved function with less pain   Time 8   Period Weeks   Status On-going   PT LONG TERM GOAL #5   Title Patient will be independent in methods to control/eliminate right posterior thigh pain.   Time 4   Period Weeks   Status New               Plan - 05/25/15 1112    Clinical Impression Statement The patient arrives 13 min late.  She reports increased pain in right > left buttock region as well as the return of right posterior thigh radiating pain after sitting yesterday.  The patient had been doing well and treatment was focused on core strengthening.  Today, session focused on pain relief from myofascial tenderness gluteals and piriformis with dry needling and manual techniques.  She reports improved mobility and decreased pain post session.  Recommend continued PT 1x/week for 4 more weeks for further pain relief and core strengthening.   Pt will benefit from skilled therapeutic intervention in order to improve on the following deficits Pain;Increased muscle spasms;Decreased strength;Decreased range of motion   Rehab Potential Good   PT Frequency 1x / week   PT Duration 4 weeks   PT Treatment/Interventions Iontophoresis 62m/ml Dexamethasone;Moist Heat;Electrical Stimulation;Cryotherapy;Ultrasound;Therapeutic exercise;Manual techniques;Dry needling;Taping;Patient/family education   PT Next Visit Plan continue 1x/week for 4 more weeks for dry needling, manual techniques and core strengthening in preparation for independent self management         Problem List There are no active problems to display for this patient.   SAlvera Singh12/06/2014, 11:26 AM  [facility] [address]Phone: [phone]  Fax:  [phone] Name: JARLEN LEGENDREMRN: [id number] of Birth: 4Dec 20, 1977  SRuben Im PT 05/25/2015 11:28  AM Phone: [phone]: [phone]

## 2015-05-25 NOTE — Patient Instructions (Signed)
If feel radiating pain while sitting:  1) Stand up and walk  2)  Try lumbar extension in prone or standing  3)  Piriformis stretches

## 2015-05-30 ENCOUNTER — Ambulatory Visit: Payer: 59 | Admitting: Physical Therapy

## 2015-05-30 DIAGNOSIS — M5442 Lumbago with sciatica, left side: Secondary | ICD-10-CM | POA: Diagnosis not present

## 2015-05-30 DIAGNOSIS — M5441 Lumbago with sciatica, right side: Secondary | ICD-10-CM

## 2015-05-30 DIAGNOSIS — M6281 Muscle weakness (generalized): Secondary | ICD-10-CM

## 2015-05-30 NOTE — Therapy (Signed)
[place], [place], [address] Phone: [phone]   Fax:  [phone]  Physical Therapy Treatment  Patient Details  Name: [name] MRN: [id number] Date of Birth: 1976-02-14 No Data Recorded  Encounter Date: 05/30/2015      PT End of Session - 05/30/15 1328    Visit Number 15   Number of Visits 22   Date for PT Re-Evaluation 06/22/15   Authorization Type UMR   PT Start Time 0930   PT Stop Time 1025   PT Time Calculation (min) 55 min   Activity Tolerance Patient tolerated treatment well      Past Medical History  Diagnosis Date  . Delivery by emergency caesarean section     Past Surgical History  Procedure Laterality Date  . Cesarean section       x 3 '04, '08, '10    There were no vitals filed for this visit.  Visit Diagnosis:  Muscle weakness of lower extremity  Right-sided low back pain with right-sided sciatica      Subjective Assessment - 05/30/15 0936    Subjective I felt awesome after last time dry needling!.  A mild QL region pain.  Mild right thigh pain with driving.     Currently in Pain? Yes   Pain Score 1    Pain Location Hip   Pain Orientation Right   Pain Type Chronic pain   Pain Onset More than a month ago   Pain Frequency Intermittent   Aggravating Factors  driving                         [facility] Adult PT Treatment/Exercise - 05/30/15 0001    Lumbar Exercises: Sidelying   Other Sidelying Lumbar Exercises QL stretches in sidelying,kneeling and standing 3x 20 sec each   Moist Heat Therapy   Number Minutes Moist Heat 10 Minutes   Moist Heat Location Lumbar Spine  Right QL in sidelying over pillow   Manual Therapy   Manual Therapy Muscle Energy Technique   Joint Mobilization long axis distraction, inferior mob and AP in IR 3x 20 sec each right only   Soft tissue mobilization right QL, gluteals and piriformis          Trigger Point Dry  Needling - 05/30/15 1326    Consent Given? Yes   Muscles Treated Upper Body Quadratus Lumborum  right with multiple twitch responses   Gluteus Maximus Response Palpable increased muscle length   Piriformis Response Palpable increased muscle length       Right only       PT Education - 05/30/15 1327    Education provided Yes   Education Details QL stretches in sidelying, kneeling and standing   Person(s) Educated Patient   Methods Explanation;Demonstration;Handout   Comprehension Verbalized understanding;Returned demonstration          PT Short Term Goals - 05/30/15 1334    PT SHORT TERM GOAL #1   Title Patient will be independent in basic self care including postural correction when sitting, basic body mechanics with vacumning   Status Achieved   PT SHORT TERM GOAL #2   Title Patient will report a 25% improvement in pain/function   Status Achieved           PT Long Term Goals - 05/30/15 1334    PT LONG TERM GOAL #1   Title independent with HEP and safe self progression for further strengthening  Time 8   Period Weeks   Status Partially Met   PT LONG TERM GOAL #2   Title Bilateral hip and core strength 4+/5 needed for home and work duties   Time 8   Period Weeks   Status On-going   PT LONG TERM GOAL #3   Title Patient will report > 50% improvement in pain and decreased use of pain medication   Status Achieved   PT LONG TERM GOAL #4   Title FOTO functional outcome score improved from 43% limitation to 33% indicating improved function with less pain   Time 8   Period Weeks   Status On-going   PT LONG TERM GOAL #5   Title Patient will be independent in methods to control/eliminate right posterior thigh pain.   Time 4   Period Weeks   Status On-going               Plan - 05/30/15 1328    Clinical Impression Statement The patient reports excellent relief from dry needling last session. She has minor pain today in right quadruatus lumborum muscle  region.  With dry needling, she has multiple twitch responses and improved muscle length to facilitate muscle length for mobilization/manual therapy.  She is receptive to adding new QL stretches for home.  Reports good compliance with core strengthening.   Therapist closely monitoring response throughout.     PT Next Visit Plan continue 1x/week for 2 more weeks for dry needling, manual techniques and core strengthening in preparation for independent self management         Problem List There are no active problems to display for this patient.   [name] 05/30/2015, 1:37 PM  [facility] [address] [place], [place], [address] Phone: [phone]   Fax:  [phone]  Name: [name] MRN: [id number] Date of Birth: August 06, 1975   Ruben Im, PT 05/30/2015 1:38 PM Phone: [phone] Fax: [phone]

## 2015-06-06 ENCOUNTER — Ambulatory Visit: Payer: 59 | Admitting: Physical Therapy

## 2015-06-06 DIAGNOSIS — M6281 Muscle weakness (generalized): Secondary | ICD-10-CM

## 2015-06-06 DIAGNOSIS — M5441 Lumbago with sciatica, right side: Secondary | ICD-10-CM

## 2015-06-06 DIAGNOSIS — M5442 Lumbago with sciatica, left side: Secondary | ICD-10-CM | POA: Diagnosis not present

## 2015-06-06 DIAGNOSIS — M25552 Pain in left hip: Secondary | ICD-10-CM

## 2015-06-06 NOTE — Therapy (Signed)
[name], [name], [address] Phone: [phone]   Fax:  [phone]  Physical Therapy Treatment  Patient Details  Name: [name] MRN: [id number] Date of Birth: 07/24/1975 No Data Recorded  Encounter Date: 06/06/2015      PT End of Session - 06/06/15 1234    Visit Number 16   Number of Visits 22   Date for PT Re-Evaluation 06/22/15   PT Start Time 1100   PT Stop Time 1145   PT Time Calculation (min) 45 min   Activity Tolerance Patient tolerated treatment well   Behavior During Therapy [facility] for tasks assessed/performed      Past Medical History  Diagnosis Date  . Delivery by emergency caesarean section     Past Surgical History  Procedure Laterality Date  . Cesarean section       x 3 '04, '08, '10    There were no vitals filed for this visit.  Visit Diagnosis:  Muscle weakness of lower extremity  Right-sided low back pain with right-sided sciatica  Bilateral low back pain with left-sided sciatica  Hip pain, left      Subjective Assessment - 06/06/15 1112    Subjective "I felt much better after the last session, and have been doing"    Currently in Pain? No/denies   Pain Score 0-No pain   Pain Location Hip   Pain Orientation Right   Pain Onset More than a month ago   Pain Frequency Intermittent   Aggravating Factors  driving                         [facility] Adult PT Treatment/Exercise - 06/06/15 1233    Self-Care   Self-Care Other Self-Care Comments   Other Self-Care Comments  stretching hamstrings more often throughout the day to combate tightness inthe hamstring from general use   Lumbar Exercises: Stretches   Active Hamstring Stretch 4 reps;30 seconds  PNF contract relax with 10 sec hold   ITB Stretch 2 reps;30 seconds  in standing   Lumbar Exercises: Aerobic   Stationary Bike Nu-Step L7 x 8 min   Lumbar Exercises: Sidelying   Other Sidelying Lumbar  Exercises QL stretches in sidelying,kneeling and standing 3x 30 sec each   Lumbar Exercises: Prone   Other Prone Lumbar Exercises prone multifidus isometrics 10 x 5 sec; multifidus isometrics with hamstring curl x 10 bil   Lumbar Exercises: Quadruped   Plank 3 x 15 sec hold  with alternating hip extension   Manual Therapy   Manual Therapy Muscle Energy Technique   Joint Mobilization long axis distraction, inferior mob and AP in IR 3x 20 sec each right only   Muscle Energy Technique in L sidelying with resisted hip flexion and anterior innominate mobs 4 x 10 seconds                PT Education - 06/06/15 1233    Education provided Yes   Education Details HEP review, and updated   Person(s) Educated Patient   Methods Explanation   Comprehension Verbalized understanding          PT Short Term Goals - 05/30/15 1334    PT SHORT TERM GOAL #1   Title Patient will be independent in basic self care including postural correction when sitting, basic body mechanics with vacumning   Status Achieved   PT SHORT TERM GOAL #2   Title Patient will report a  25% improvement in pain/function   Status Achieved           PT Long Term Goals - 05/30/15 1334    PT LONG TERM GOAL #1   Title independent with HEP and safe self progression for further strengthening   Time 8   Period Weeks   Status Partially Met   PT LONG TERM GOAL #2   Title Bilateral hip and core strength 4+/5 needed for home and work duties   Time 8   Period Weeks   Status On-going   PT LONG TERM GOAL #3   Title Patient will report > 50% improvement in pain and decreased use of pain medication   Status Achieved   PT LONG TERM GOAL #4   Title FOTO functional outcome score improved from 43% limitation to 33% indicating improved function with less pain   Time 8   Period Weeks   Status On-going   PT LONG TERM GOAL #5   Title Patient will be independent in methods to control/eliminate right posterior thigh pain.    Time 4   Period Weeks   Status On-going               Plan - 06/06/15 1234    Clinical Impression Statement [name] reports that she is doing much better since the DN last visit. Focused todays treatment to correct R posteriorly rotated innominate, which she reported no pain following. she performed all core exercises without complaint. updated HEP to include standing hamstring stretching so it could be performed more often.    PT Next Visit Plan continue 1x/week for 2 more weeks for dry needling, manual techniques and core strengthening in preparation for independent self management    PT Home Exercise Plan standing hamstring stretch, IT B stretching, progress plank exercises   Consulted and Agree with Plan of Care Patient        Problem List There are no active problems to display for this patient.  Starr Lake PT, DPT, LAT, ATC  06/06/2015  12:43 PM     [facility] [address] [place], [name], [address] Phone: [phone]   Fax:  [phone]  Name: [name] MRN: [id number] Date of Birth: 02-02-1976

## 2015-06-13 ENCOUNTER — Encounter: Payer: Self-pay | Admitting: Physical Therapy

## 2015-06-16 ENCOUNTER — Ambulatory Visit: Payer: 59 | Admitting: Physical Therapy

## 2015-06-16 DIAGNOSIS — M6281 Muscle weakness (generalized): Secondary | ICD-10-CM

## 2015-06-16 DIAGNOSIS — M5442 Lumbago with sciatica, left side: Secondary | ICD-10-CM | POA: Diagnosis not present

## 2015-06-16 DIAGNOSIS — M5441 Lumbago with sciatica, right side: Secondary | ICD-10-CM

## 2015-06-16 NOTE — Therapy (Signed)
[place], [place], [address] Phone: [phone]   Fax:  [phone]  Physical Therapy Treatment/Recertification  Patient Details  Name: [name] MRN: [id number] Date of Birth: 12/08/1975 No Data Recorded  Encounter Date: 06/16/2015      PT End of Session - 06/16/15 1013    Visit Number 17   Number of Visits 22   Date for PT Re-Evaluation 07/21/15   Authorization Type UMR   PT Start Time 0930   PT Stop Time 1015   PT Time Calculation (min) 45 min   Activity Tolerance Patient tolerated treatment well      Past Medical History  Diagnosis Date  . Delivery by emergency caesarean section     Past Surgical History  Procedure Laterality Date  . Cesarean section       x 3 '04, '08, '10    There were no vitals filed for this visit.  Visit Diagnosis:  Muscle weakness of lower extremity - Plan: PT plan of care cert/re-cert  Right-sided low back pain with right-sided sciatica - Plan: PT plan of care cert/re-cert  Bilateral low back pain with left-sided sciatica - Plan: PT plan of care cert/re-cert      Subjective Assessment - 06/16/15 0929    Subjective I'm stretching the HS a lot and that helps.  "I told Vania Rea I need to step it up a bit."  Reports she felt something at church.  Driving has been a whole lot better.    I still feel like I need some guidance on strengthening my core.    I wrapped some presents and did fine.     Currently in Pain? Yes   Pain Score 1    Pain Location Back   Pain Orientation Right   Pain Type Chronic pain   Pain Onset More than a month ago   Pain Frequency Intermittent            [facility] PT Assessment - 06/16/15 0942    AROM   Lumbar Flexion 45   Lumbar Extension 30   Lumbar - Right Side Bend 40   Lumbar - Left Side Bend 40   Strength   Right Hip ABduction 5/5   Left Hip ABduction 5/5   Lumbar Flexion 4+/5   Lumbar Extension 4+/5                      [facility] Adult PT Treatment/Exercise - 06/16/15 0001    Lumbar Exercises: Stretches   Double Knee to Chest Stretch 3 reps;30 seconds   ITB Stretch --  Review of previous HEP stretches   Lumbar Exercises: Prone   Other Prone Lumbar Exercises prone multifidus isometrics 10 x 5 sec; multifidus isometrics with hamstring curl x 10 bil   Other Prone Lumbar Exercises planks with feet stacked 2x   Lumbar Exercises: Quadruped   Opposite Arm/Leg Raise Right arm/Left leg;Left arm/Right leg;15 reps   Shoulder Exercises: Standing   Other Standing Exercises red band shoulder pull downs with ab brace single leg stand   Other Standing Exercises diagonal pull downs red band SLS 15x R/L                PT Education - 06/16/15 1111    Education provided Yes   Education Details ab brace grade 3/4 and multifidi prone series   Person(s) Educated Patient   Methods Explanation;Demonstration;Handout   Comprehension Verbalized understanding;Returned demonstration  PT Short Term Goals - 06/16/15 1117    PT SHORT TERM GOAL #1   Title Patient will be independent in basic self care including postural correction when sitting, basic body mechanics with vacumning   Status Achieved   PT SHORT TERM GOAL #2   Title Patient will report a 25% improvement in pain/function   Status Achieved           PT Long Term Goals - 06/16/15 1117    PT LONG TERM GOAL #1   Title independent with intermediate level HEP and safe self progression for further strengthening without pain exacerbation   Time 8   Period Weeks   Status Partially Met   PT LONG TERM GOAL #2   Title Bilateral hip and core strength 4+/5 needed for home and work duties   Status Achieved   PT [place] #3   Title Patient will report > 50% improvement in pain and decreased use of pain medication   Status Achieved   PT LONG TERM GOAL #4   Title FOTO functional outcome score improved from 43%  limitation to 33% indicating improved function with less pain   Time 8   Period Weeks   Status On-going   PT LONG TERM GOAL #5   Title Patient will be independent in methods to control/eliminate right posterior thigh pain.   Time 4   Period Weeks   Status On-going               Plan - 06/16/15 1112    Clinical Impression Statement The patient reports she is improving and more conscientous of body mechanics with wrapping Christmas gifts and changing positions.  She feels she is ready to "step it up" on exercise but is fearful because of her past history of exacerbations.  She has made good improvements with lumbar AROM but stiff with lumbar flexion today.  Hip and core strength improving.  Recommend 1-2 more visits over the next 2-3 weeks to ensure patient is able to progress exercises without pain exacerbation prior to discharge for independent self management.  Progressing with LTGs.     Pt will benefit from skilled therapeutic intervention in order to improve on the following deficits Pain;Increased muscle spasms;Decreased strength;Decreased range of motion   Rehab Potential Good   PT Frequency Biweekly   PT Duration 4 weeks   PT Treatment/Interventions Iontophoresis 20m/ml Dexamethasone;Moist Heat;Electrical Stimulation;Cryotherapy;Ultrasound;Therapeutic exercise;Manual techniques;Dry needling;Taping;Patient/family education   PT Next Visit Plan follow up in 2 weeks as patient continues with HEP progression ; do FOTO;  recheck lumbar flexion AROM        Problem List There are no active problems to display for this patient.  SRuben Im PT 06/16/2015 11:22 AM Phone: [phone]: [phone] Singh12/23/2016, 11:22 AM  [facility]GW[place] [address]Phone: [phone]  Fax:  [phone] Name: JELIZABELLA NOLETMRN: [id number] of Birth: [birth year]

## 2015-06-27 MED FILL — BUPROPION HCL XL 300 MG TAB: 300 | 90 days supply | Qty: 90 | Fill #1

## 2015-06-27 MED FILL — ESOMEPRAZOLE MAG DR 40 MG C: 40 | 90 days supply | Qty: 90 | Fill #1

## 2015-06-29 DIAGNOSIS — H18613 Keratoconus, stable, bilateral: Secondary | ICD-10-CM | POA: Diagnosis not present

## 2015-06-30 ENCOUNTER — Ambulatory Visit: Payer: 59 | Attending: Family Medicine | Admitting: Physical Therapy

## 2015-06-30 DIAGNOSIS — M5442 Lumbago with sciatica, left side: Secondary | ICD-10-CM | POA: Insufficient documentation

## 2015-06-30 DIAGNOSIS — M5441 Lumbago with sciatica, right side: Secondary | ICD-10-CM | POA: Insufficient documentation

## 2015-06-30 DIAGNOSIS — M6281 Muscle weakness (generalized): Secondary | ICD-10-CM | POA: Diagnosis not present

## 2015-06-30 DIAGNOSIS — M25552 Pain in left hip: Secondary | ICD-10-CM | POA: Diagnosis not present

## 2015-06-30 DIAGNOSIS — M25511 Pain in right shoulder: Secondary | ICD-10-CM | POA: Diagnosis not present

## 2015-06-30 NOTE — Therapy (Signed)
[place], [place], [address] Phone: [phone]   Fax:  [phone]  Physical Therapy Treatment/Discharge Summary  Patient Details  Name: [name] MRN: [id number] Date of Birth: [birth year] No Data Recorded  Encounter Date: 06/30/2015      PT End of Session - 06/30/15 1119    Visit Number 18   Number of Visits 22   Date for PT Re-Evaluation 07/21/15   Authorization Type [employer]   PT Start Time 0930   PT Stop Time 1015   PT Time Calculation (min) 45 min   Activity Tolerance Patient tolerated treatment well      Past Medical History  Diagnosis Date  . Delivery by emergency caesarean section     Past Surgical History  Procedure Laterality Date  . Cesarean section       x 3 '04, '08, '10    There were no vitals filed for this visit.  Visit Diagnosis:  Muscle weakness of lower extremity  Right-sided low back pain with right-sided sciatica  Bilateral low back pain with left-sided sciatica  Hip pain, left  Pain in joint, shoulder region, right      Subjective Assessment - 06/30/15 0934    Subjective It's been really good although sat for the boys basketball game yesterday and did fine at the time but sore today.  Exercises are going well.  My hips hurt some in the morning.     Currently in Pain? No/denies   Pain Score 0-No pain   Pain Location Back            [facility] PT Assessment - 06/30/15 0942    Observation/Other Assessments   Focus on Therapeutic Outcomes (FOTO)  37% limitation   AROM   Lumbar Flexion 55   Lumbar Extension 30   Lumbar - Right Side Bend 40   Lumbar - Left Side Bend 40   Strength   Right Hip ABduction 5/5   Left Hip ABduction 5/5   Lumbar Flexion 4+/5   Lumbar Extension 4+/5                     [facility] Adult PT Treatment/Exercise - 06/30/15 0001    Lumbar Exercises: Stretches   Active Hamstring Stretch 3 reps;30 seconds   Piriformis Stretch  --  review of HEP and safe self progression   Lumbar Exercises: Aerobic   Elliptical 10 min                PT Education - 06/30/15 1118    Education provided Yes   Education Details review of HEP and safe self progression;  info on community beginner Pilates 6 week program   Person(s) Educated Patient   Methods Explanation   Comprehension Verbalized understanding          PT Short Term Goals - 06/16/15 1117    PT SHORT TERM GOAL #1   Title Patient will be independent in basic self care including postural correction when sitting, basic body mechanics with vacumning   Status Achieved   PT SHORT TERM GOAL #2   Title Patient will report a 25% improvement in pain/function   Status Achieved           PT Long Term Goals - 06/30/15 0958    PT LONG TERM GOAL #1   Title independent with intermediate level HEP and safe self progression for further strengthening without pain exacerbation   Status Achieved   PT  LONG TERM GOAL #2   Title Bilateral hip and core strength 4+/5 needed for home and work duties   Status Achieved   PT [place] #3   Title Patient will report > 50% improvement in pain and decreased use of pain medication   Status Achieved   PT LONG TERM GOAL #4   Title FOTO functional outcome score improved from 43% limitation to 33% indicating improved function with less pain   Status Partially Met   PT LONG TERM GOAL #5   Title Patient will be independent in methods to control/eliminate right posterior thigh pain.   Status Achieved               Plan - 06/30/15 1140    Clinical Impression Statement The patient has progressed well with pain reduction, return to function and strength of hip and core muscles.  She is now able to independently manage her HEP and strategies to control  pain.  She has met the majority of rehab goals and is in agreement with discharge to an independent HEP and gym progam.         PHYSICAL THERAPY DISCHARGE  SUMMARY  Visits from Start of Care: 18  Current functional level related to goals / functional outcomes: See clinical impressions above.     Remaining deficits: As above   Education / Equipment: Comprehensive HEP  Plan: Patient agrees to discharge.  Patient goals were met. Patient is being discharged due to meeting the stated rehab goals.  ?????        Problem List There are no active problems to display for this patient.  Ruben Im, PT 06/30/2015 11:45 AM Phone: [phone] Fax: [phone]  [name] 06/30/2015, 11:43 AM  [facility] [address] [place], [place], [address] Phone: [phone]   Fax:  [phone]  Name: [name] MRN: [id number] Date of Birth: 1976-04-22

## 2015-09-28 MED FILL — ESOMEPRAZOLE MAG DR 40 MG C: 40 | 90 days supply | Qty: 90 | Fill #0

## 2015-09-28 MED FILL — BUPROPION HCL XL 300 MG TAB: 300 | 90 days supply | Qty: 90 | Fill #0

## 2015-10-24 DIAGNOSIS — M722 Plantar fascial fibromatosis: Secondary | ICD-10-CM | POA: Diagnosis not present

## 2015-12-21 DIAGNOSIS — Z1231 Encounter for screening mammogram for malignant neoplasm of breast: Secondary | ICD-10-CM | POA: Diagnosis not present

## 2015-12-21 DIAGNOSIS — Z130 Encounter for screening for diseases of the blood and blood-forming organs and certain disorders involving the immune mechanism: Secondary | ICD-10-CM | POA: Diagnosis not present

## 2015-12-21 DIAGNOSIS — Z13228 Encounter for screening for other metabolic disorders: Secondary | ICD-10-CM | POA: Diagnosis not present

## 2015-12-21 DIAGNOSIS — Z131 Encounter for screening for diabetes mellitus: Secondary | ICD-10-CM | POA: Diagnosis not present

## 2015-12-21 DIAGNOSIS — Z6829 Body mass index (BMI) 29.0-29.9, adult: Secondary | ICD-10-CM | POA: Diagnosis not present

## 2015-12-21 DIAGNOSIS — Z13220 Encounter for screening for lipoid disorders: Secondary | ICD-10-CM | POA: Diagnosis not present

## 2015-12-21 DIAGNOSIS — Z01419 Encounter for gynecological examination (general) (routine) without abnormal findings: Secondary | ICD-10-CM | POA: Diagnosis not present

## 2015-12-21 DIAGNOSIS — Z1321 Encounter for screening for nutritional disorder: Secondary | ICD-10-CM | POA: Diagnosis not present

## 2015-12-21 DIAGNOSIS — Z1329 Encounter for screening for other suspected endocrine disorder: Secondary | ICD-10-CM | POA: Diagnosis not present

## 2015-12-25 ENCOUNTER — Other Ambulatory Visit: Payer: Self-pay | Admitting: Obstetrics and Gynecology

## 2015-12-25 DIAGNOSIS — R928 Other abnormal and inconclusive findings on diagnostic imaging of breast: Secondary | ICD-10-CM

## 2015-12-28 MED FILL — BUPROPION HCL XL 300 MG TAB: 300 | 90 days supply | Qty: 90 | Fill #0

## 2015-12-28 MED FILL — ESOMEPRAZOLE MAG DR 40 MG C: 40 | 90 days supply | Qty: 90 | Fill #0

## 2015-12-28 MED FILL — TRANSDERM-SCOP 1.5 MG/3 DAY: 1 | 12 days supply | Qty: 4 | Fill #0

## 2016-01-02 ENCOUNTER — Ambulatory Visit
Admission: RE | Admit: 2016-01-02 | Discharge: 2016-01-02 | Disposition: A | Discharge status: Home / Self Care | Payer: 59 | Source: Ambulatory Visit | Attending: Obstetrics and Gynecology | Admitting: Obstetrics and Gynecology

## 2016-01-02 DIAGNOSIS — N63 Unspecified lump in breast: Secondary | ICD-10-CM | POA: Diagnosis not present

## 2016-01-02 DIAGNOSIS — R928 Other abnormal and inconclusive findings on diagnostic imaging of breast: Secondary | ICD-10-CM

## 2016-03-27 MED FILL — BUPROPION HCL XL 300 MG TAB: 300 | 90 days supply | Qty: 90 | Fill #1

## 2016-03-27 MED FILL — ESOMEPRAZOLE MAG DR 40 MG C: 40 | 90 days supply | Qty: 90 | Fill #1

## 2016-05-31 DIAGNOSIS — Z0000 Encounter for general adult medical examination without abnormal findings: Secondary | ICD-10-CM | POA: Diagnosis not present

## 2016-05-31 DIAGNOSIS — M25531 Pain in right wrist: Secondary | ICD-10-CM | POA: Diagnosis not present

## 2016-05-31 DIAGNOSIS — J309 Allergic rhinitis, unspecified: Secondary | ICD-10-CM | POA: Diagnosis not present

## 2016-05-31 DIAGNOSIS — K580 Irritable bowel syndrome with diarrhea: Secondary | ICD-10-CM | POA: Diagnosis not present

## 2016-05-31 DIAGNOSIS — F39 Unspecified mood [affective] disorder: Secondary | ICD-10-CM | POA: Diagnosis not present

## 2016-06-03 ENCOUNTER — Other Ambulatory Visit: Payer: Self-pay | Admitting: Obstetrics and Gynecology

## 2016-06-03 DIAGNOSIS — N6320 Unspecified lump in the left breast, unspecified quadrant: Secondary | ICD-10-CM

## 2016-06-26 MED FILL — ESOMEPRAZOLE MAG DR 40 MG C: 40 | 90 days supply | Qty: 90 | Fill #2

## 2016-06-26 MED FILL — BUPROPION HCL XL 300 MG TAB: 300 | 90 days supply | Qty: 90 | Fill #2

## 2016-07-04 DIAGNOSIS — H18613 Keratoconus, stable, bilateral: Secondary | ICD-10-CM | POA: Diagnosis not present

## 2016-07-05 ENCOUNTER — Ambulatory Visit
Admission: RE | Admit: 2016-07-05 | Discharge: 2016-07-05 | Disposition: A | Discharge status: Home / Self Care | Payer: 59 | Source: Ambulatory Visit | Attending: Obstetrics and Gynecology | Admitting: Obstetrics and Gynecology

## 2016-07-05 DIAGNOSIS — R922 Inconclusive mammogram: Secondary | ICD-10-CM | POA: Diagnosis not present

## 2016-07-05 DIAGNOSIS — N6320 Unspecified lump in the left breast, unspecified quadrant: Secondary | ICD-10-CM

## 2016-07-05 DIAGNOSIS — N6489 Other specified disorders of breast: Secondary | ICD-10-CM | POA: Diagnosis not present

## 2016-09-24 MED FILL — ESOMEPRAZOLE MAG DR 40 MG C: 40 | 90 days supply | Qty: 90 | Fill #3

## 2016-09-24 MED FILL — BUPROPION HCL XL 300 MG TAB: 300 | 90 days supply | Qty: 90 | Fill #3

## 2016-12-23 DIAGNOSIS — Z01419 Encounter for gynecological examination (general) (routine) without abnormal findings: Secondary | ICD-10-CM | POA: Diagnosis not present

## 2016-12-23 DIAGNOSIS — Z1212 Encounter for screening for malignant neoplasm of rectum: Secondary | ICD-10-CM | POA: Diagnosis not present

## 2016-12-23 DIAGNOSIS — Z6830 Body mass index (BMI) 30.0-30.9, adult: Secondary | ICD-10-CM | POA: Diagnosis not present

## 2016-12-23 MED FILL — ESOMEPRAZOLE MAG DR 40 MG C: 40 | 30 days supply | Qty: 30 | Fill #0

## 2016-12-23 MED FILL — CYCLOBENZAPRINE 10 MG TABLE: 10 | 7 days supply | Qty: 21 | Fill #0

## 2016-12-23 MED FILL — BUPROPION HCL XL 300 MG TAB: 300 | 30 days supply | Qty: 30 | Fill #0

## 2016-12-23 MED FILL — NAPROXEN SODIUM 550 MG TAB: 550 | 30 days supply | Qty: 60 | Fill #0

## 2017-01-24 MED FILL — buPROPion HCL ER (XL) 300 M: 300 | 30 days supply | Qty: 30 | Fill #1

## 2017-01-24 MED FILL — ESOMEPRAZOLE MAG DR 40 MG C: 40 | 30 days supply | Qty: 30 | Fill #1

## 2017-02-02 ENCOUNTER — Telehealth: Payer: 59 | Admitting: Nurse Practitioner

## 2017-02-02 DIAGNOSIS — M545 Low back pain, unspecified: Secondary | ICD-10-CM

## 2017-02-02 MED ORDER — CYCLOBENZAPRINE HCL 10 MG PO TABS: 10.0000 mg | ORAL_TABLET | Freq: Three times a day (TID) | ORAL | 1 refills | Status: AC | PRN

## 2017-02-02 MED ORDER — NAPROXEN 500 MG PO TABS: 500.0000 mg | ORAL_TABLET | Freq: Two times a day (BID) | ORAL | 1 refills | Status: AC

## 2017-02-02 NOTE — Progress Notes (Signed)

## 2017-02-02 NOTE — Progress Notes (Signed)
This was actually a re evisit. Patient wanted more then flexeril and naprosyn for back pain. I told them they would need a face to face visit for other meds.

## 2017-02-18 ENCOUNTER — Other Ambulatory Visit: Payer: Self-pay | Admitting: Obstetrics and Gynecology

## 2017-02-18 DIAGNOSIS — N6489 Other specified disorders of breast: Secondary | ICD-10-CM

## 2017-02-25 MED FILL — ESOMEPRAZOLE MAG DR 40 MG C: 40 | 30 days supply | Qty: 30 | Fill #2

## 2017-02-25 MED FILL — BUPROPION HCL XL 300 MG TAB: 300 | 30 days supply | Qty: 30 | Fill #2

## 2017-03-05 ENCOUNTER — Ambulatory Visit
Admission: RE | Admit: 2017-03-05 | Discharge: 2017-03-05 | Disposition: A | Discharge status: Home / Self Care | Payer: 59 | Source: Ambulatory Visit | Attending: Obstetrics and Gynecology | Admitting: Obstetrics and Gynecology

## 2017-03-05 DIAGNOSIS — N6489 Other specified disorders of breast: Secondary | ICD-10-CM

## 2017-03-05 DIAGNOSIS — R928 Other abnormal and inconclusive findings on diagnostic imaging of breast: Secondary | ICD-10-CM | POA: Diagnosis not present

## 2017-03-27 MED FILL — BUPROPION HCL XL 300 MG TAB: 300 | 30 days supply | Qty: 30 | Fill #3

## 2017-03-27 MED FILL — ESOMEPRAZOLE MAG DR 40 MG C: 40 | 30 days supply | Qty: 30 | Fill #3

## 2017-04-30 MED FILL — BUPROPION HCL XL 300 MG TAB: 300 | 30 days supply | Qty: 30 | Fill #4

## 2017-04-30 MED FILL — ESOMEPRAZOLE MAG DR 40 MG C: 40 | 30 days supply | Qty: 30 | Fill #4

## 2017-05-28 MED FILL — ESOMEPRAZOLE MAG DR 40 MG C: 40 | 30 days supply | Qty: 30 | Fill #5

## 2017-05-28 MED FILL — BUPROPION HCL XL 300 MG TAB: 300 | 30 days supply | Qty: 30 | Fill #5

## 2017-06-27 MED FILL — BUPROPION HCL XL 300 MG TAB: 300 | 30 days supply | Qty: 30 | Fill #6

## 2017-06-27 MED FILL — ESOMEPRAZOLE MAG DR 40 MG C: 40 | 30 days supply | Qty: 30 | Fill #6

## 2017-07-08 DIAGNOSIS — H18613 Keratoconus, stable, bilateral: Secondary | ICD-10-CM | POA: Diagnosis not present

## 2017-07-18 IMAGING — US ULTRASOUND LEFT BREAST LIMITED
1 series · 5 of 5 positions shown · non-contrast
Comparison: Screening mammogram dated 12/21/2015.

CLINICAL DATA: Screening recall from baseline mammography for
possible left breast mass.

EXAM:
2D DIGITAL DIAGNOSTIC UNILATERAL LEFT MAMMOGRAM WITH CAD AND ADJUNCT
TOMO
LEFT BREAST ULTRASOUND

[Series 1: ultrasound left breast limited · 0.10mm/px · 5 of 5 slices shown]
[im 1/5]
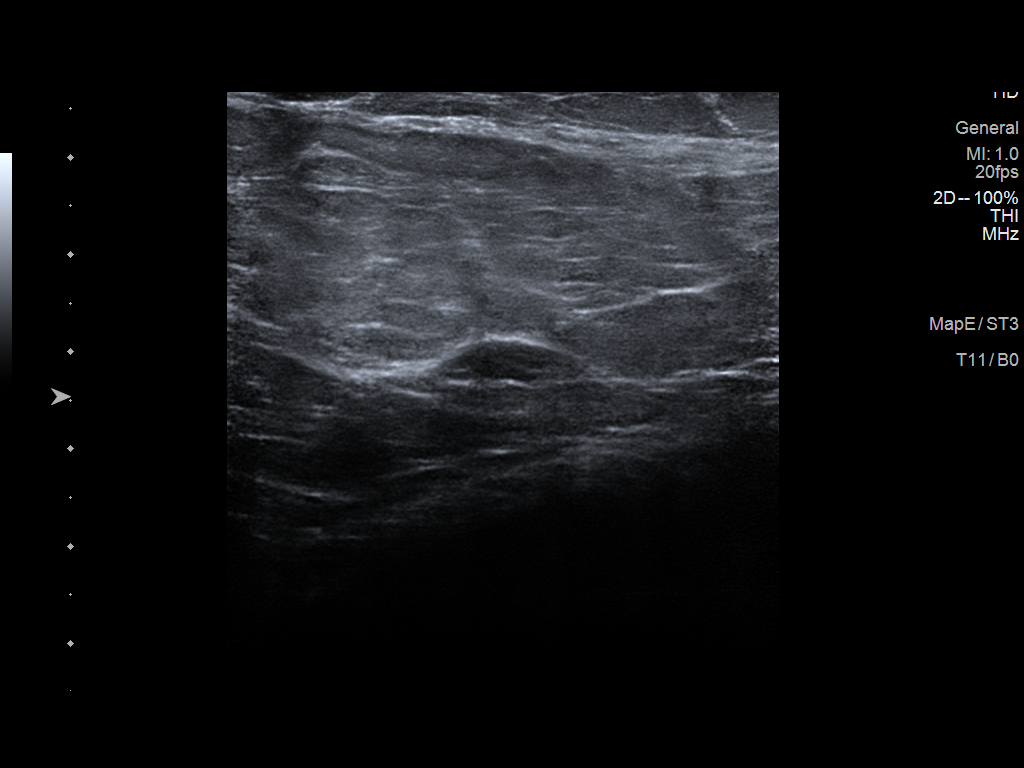
[im 2/5]
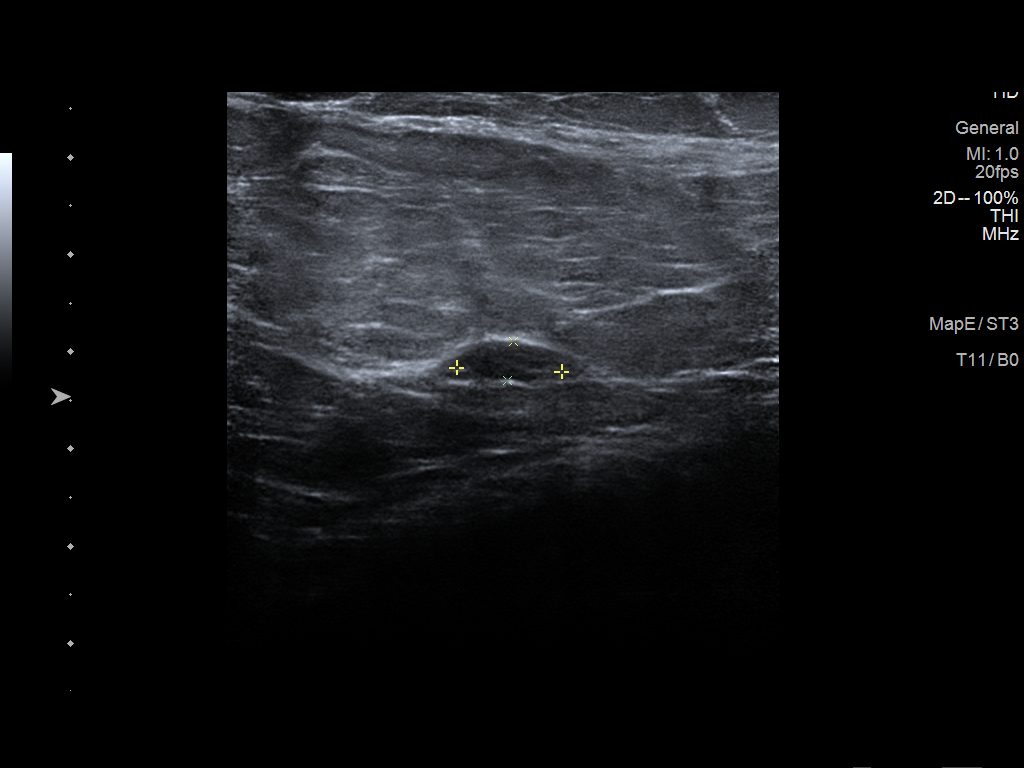
[im 3/5]
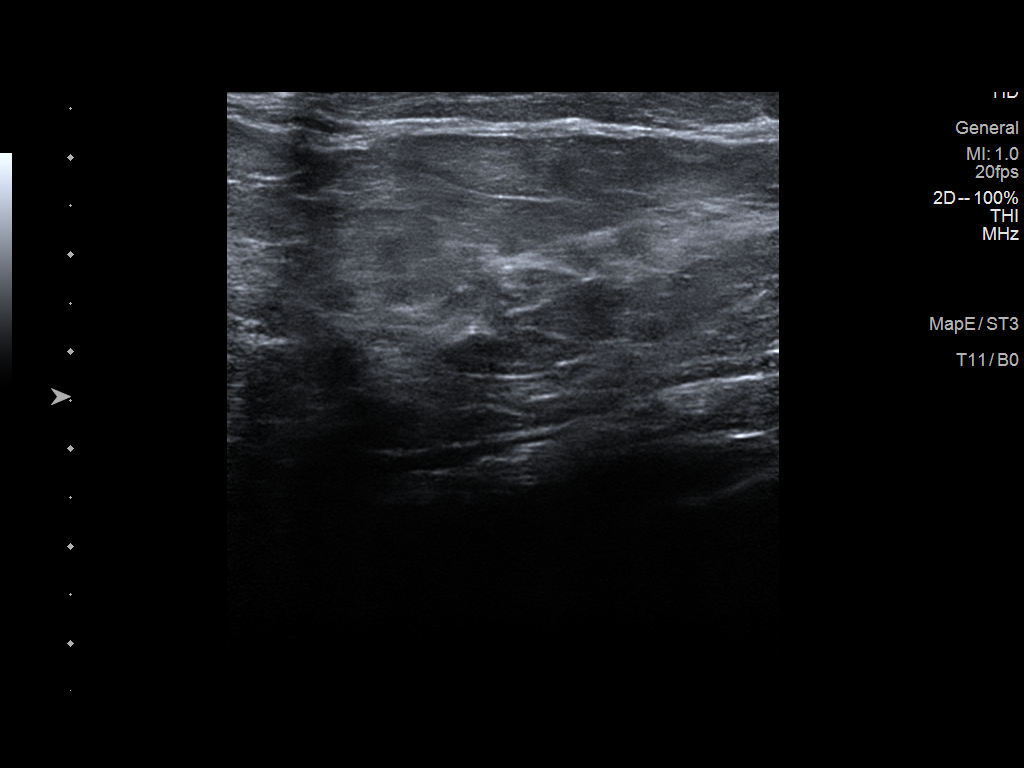
[im 4/5]
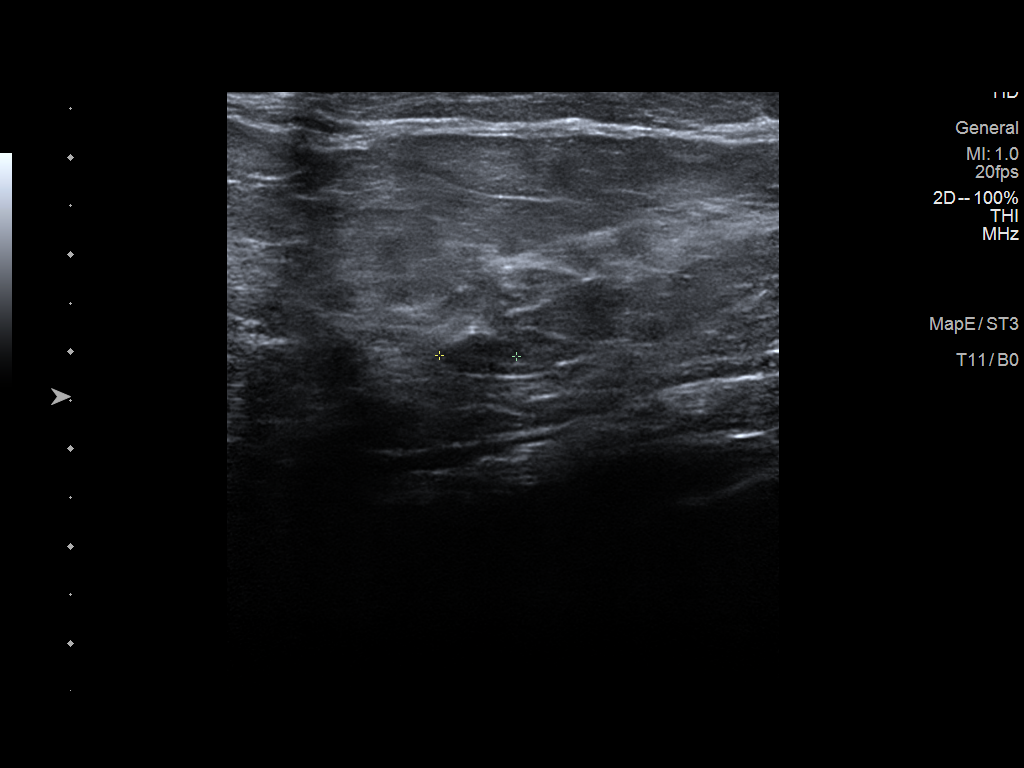
[im 5/5]
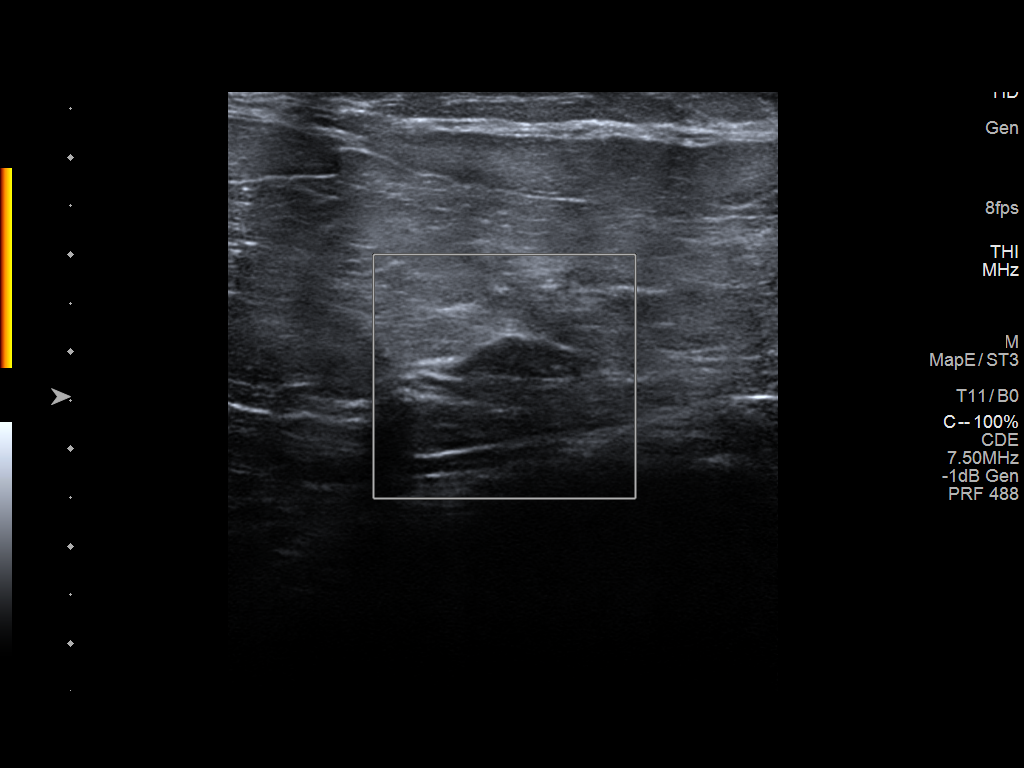

[5 of 5 positions shown; findings below may reference images not displayed]

ACR Breast Density Category b: There are scattered areas of
fibroglandular density.
FINDINGS: Spot compression CC and MLO tomograms were performed of the left
breast. There is an oval circumscribed mass in the central far
posterior left breast measuring approximately 11 mm best seen on the
spot compression CC tomograms. No persistent mass or distortion is
identified on the spot compression MLO tomograms.

Mammographic images were processed with CAD.

Physical examination of the central left breast does not reveal any
palpable masses.

Targeted ultrasound of the left breast was performed demonstrating
an oval horizontally oriented well-circumscribed hypoechoic mass at
6 o'clock 1-2 cm from the nipple measuring 1.1 x 0.4 x 0.8 cm. This
may correspond with mammography findings and demonstrates imaging
features most suggestive of a fibroadenoma.
IMPRESSION: Probably benign left breast mass.

RECOMMENDATION:
Diagnostic mammography of the left breast with possible left breast
ultrasound in 6 months.

I have discussed the findings and recommendations with the patient.
Results were also provided in writing at the conclusion of the
visit. If applicable, a reminder letter will be sent to the patient
regarding the next appointment.

BI-RADS CATEGORY  3: Probably benign.

## 2017-07-28 MED FILL — BUPROPION HCL XL 300 MG TAB: 300 | 30 days supply | Qty: 30 | Fill #7

## 2017-07-28 MED FILL — ESOMEPRAZOLE MAG DR 40 MG C: 40 | 30 days supply | Qty: 30 | Fill #7

## 2017-08-21 MED FILL — BUPROPION HCL XL 300 MG TAB: 300 | 30 days supply | Qty: 30 | Fill #8 | Status: TO

## 2017-08-21 MED FILL — ESOMEPRAZOLE MAG DR 40 MG C: 40 | 30 days supply | Qty: 30 | Fill #8 | Status: TO

## 2018-01-19 IMAGING — MG 2D DIGITAL DIAGNOSTIC UNILATERAL LEFT MAMMOGRAM WITH CAD AND ADJ
6 series · 6 of 14 positions shown · non-contrast
Comparison: Previous exam(s).

CLINICAL DATA: Six-month follow-up for a probably benign left
breast mass.

EXAM:
2D DIGITAL DIAGNOSTIC UNILATERAL LEFT MAMMOGRAM WITH CAD AND ADJUNCT
TOMO
LEFT BREAST ULTRASOUND

[L MLO]
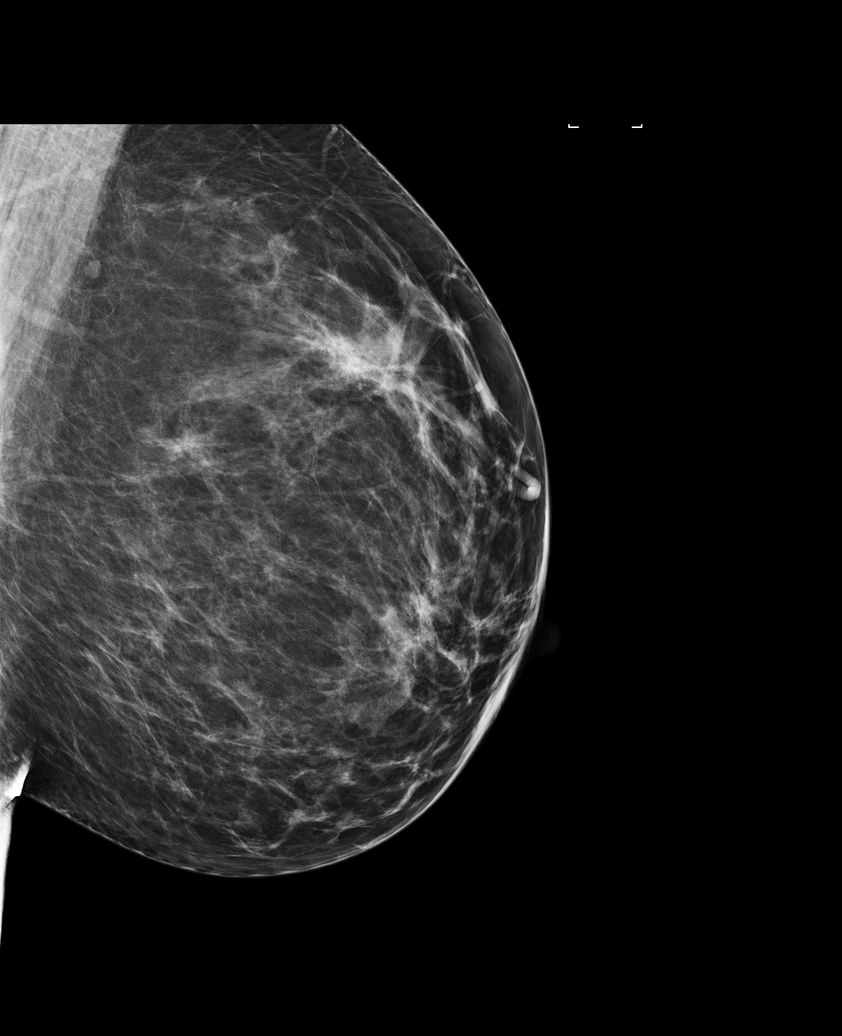

[L MLO synth-2D]
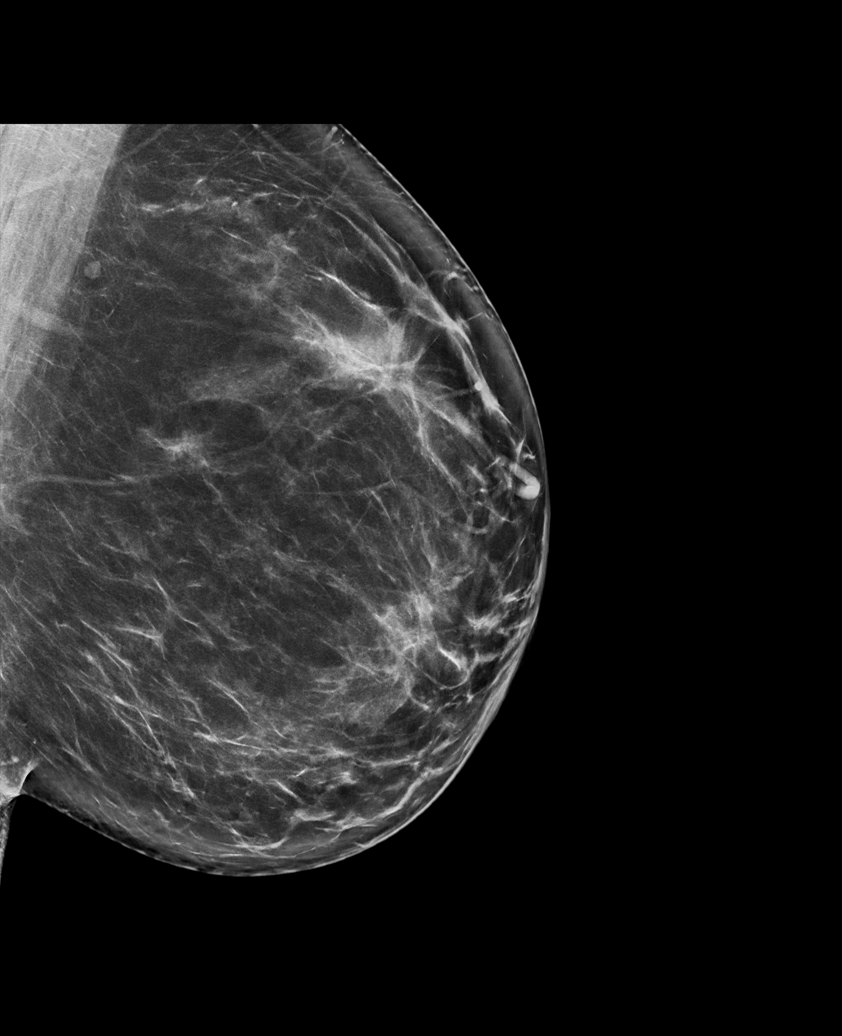

[L CC synth-2D]
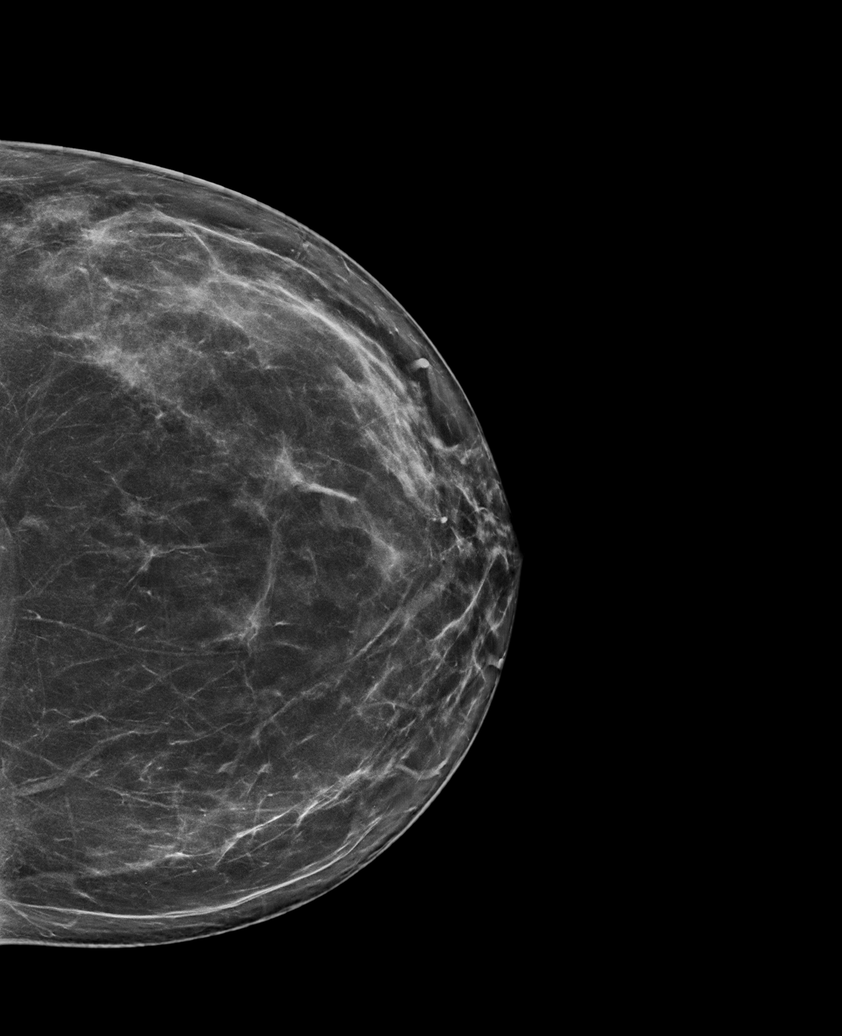

[L CC]
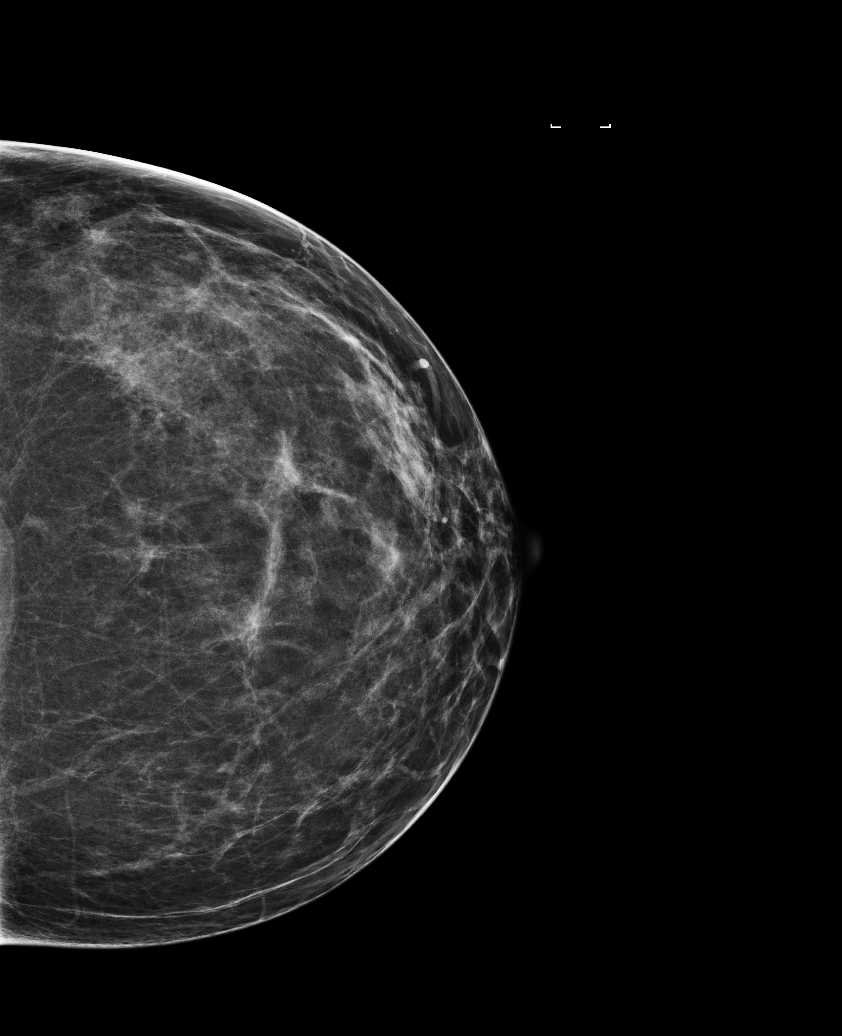

[L MLO tomo · tomo slice 48/95.0]
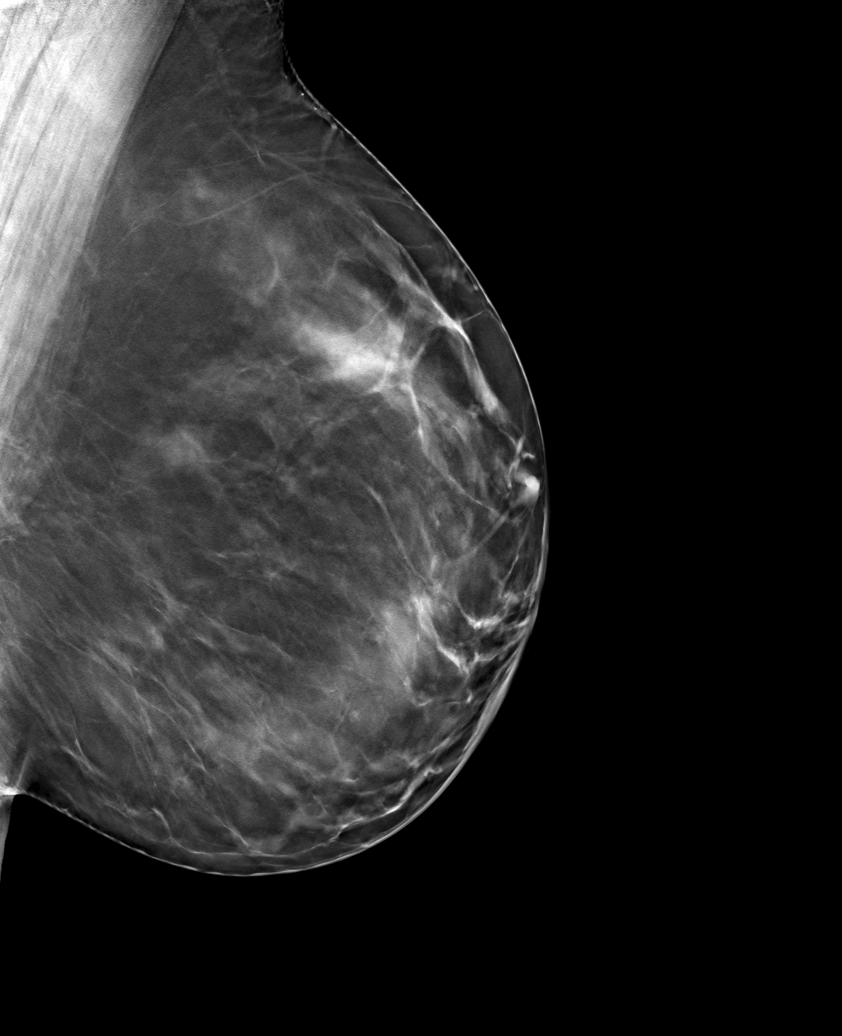

[L CC tomo · tomo slice 45/89.0]
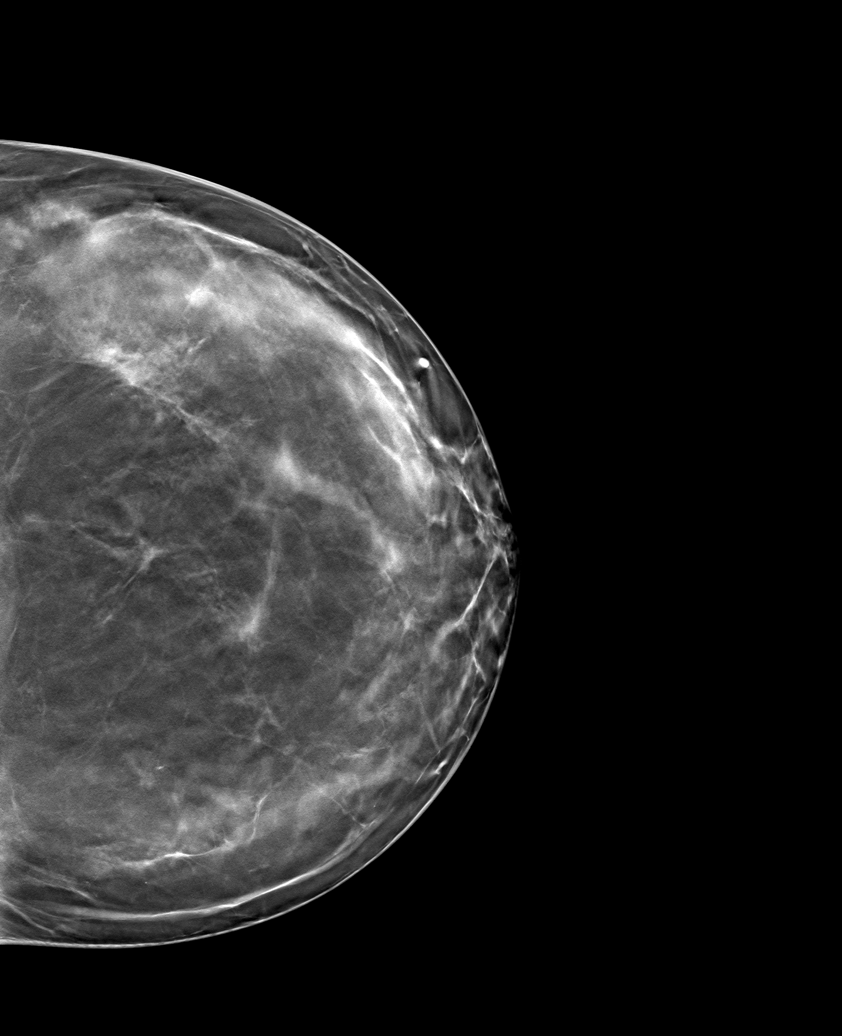

[6 of 14 positions shown; findings below may reference images not displayed]

ACR Breast Density Category b: There are scattered areas of
fibroglandular density.
FINDINGS: The focal asymmetry in the central to superior left breast appears
mammographically stable. No suspicious calcifications, masses or
areas of distortion are seen in the bilateral breasts.

Mammographic images were processed with CAD.

The mass previously identified in the left breast at 6 o'clock, 2 cm
from the nipple is no longer visualized. The area in question, I
believe, corresponds to a normal fat lobule, which on real-time
imaging extends into normal surrounding tissue.

Ultrasound of the left breast at 12 o'clock was performed for
further evaluation of the focal asymmetry in the central to superior
left breast. No abnormal tissue is identified sonographically.
IMPRESSION: 1. The mass identified in the left breast at 6 o'clock on the prior
exam is favored to represent a benign fat lobule.

2.  The focal asymmetry in the superior left breast is stable.

RECOMMENDATION:
Six-month follow-up bilateral diagnostic mammogram is recommended to
ensure stability of the left breast focal asymmetry.

I have discussed the findings and recommendations with the patient.
Results were also provided in writing at the conclusion of the
visit. If applicable, a reminder letter will be sent to the patient
regarding the next appointment.

BI-RADS CATEGORY  3: Probably benign.

## 2018-01-19 IMAGING — US ULTRASOUND LEFT BREAST LIMITED
1 series · 6 of 6 positions shown · non-contrast
Comparison: Previous exam(s).

CLINICAL DATA: Six-month follow-up for a probably benign left
breast mass.

EXAM:
2D DIGITAL DIAGNOSTIC UNILATERAL LEFT MAMMOGRAM WITH CAD AND ADJUNCT
TOMO
LEFT BREAST ULTRASOUND

[Series 1: ultrasound left breast limited · 0.10mm/px · 6 of 6 slices shown]
[im 1/6]
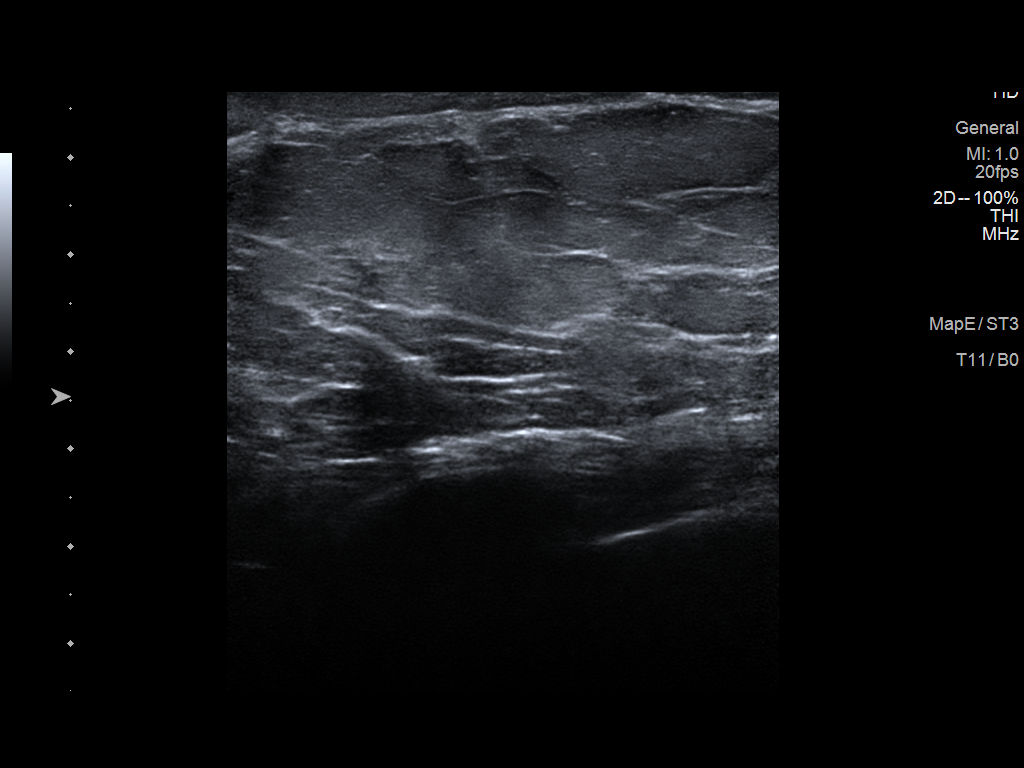
[im 2/6]
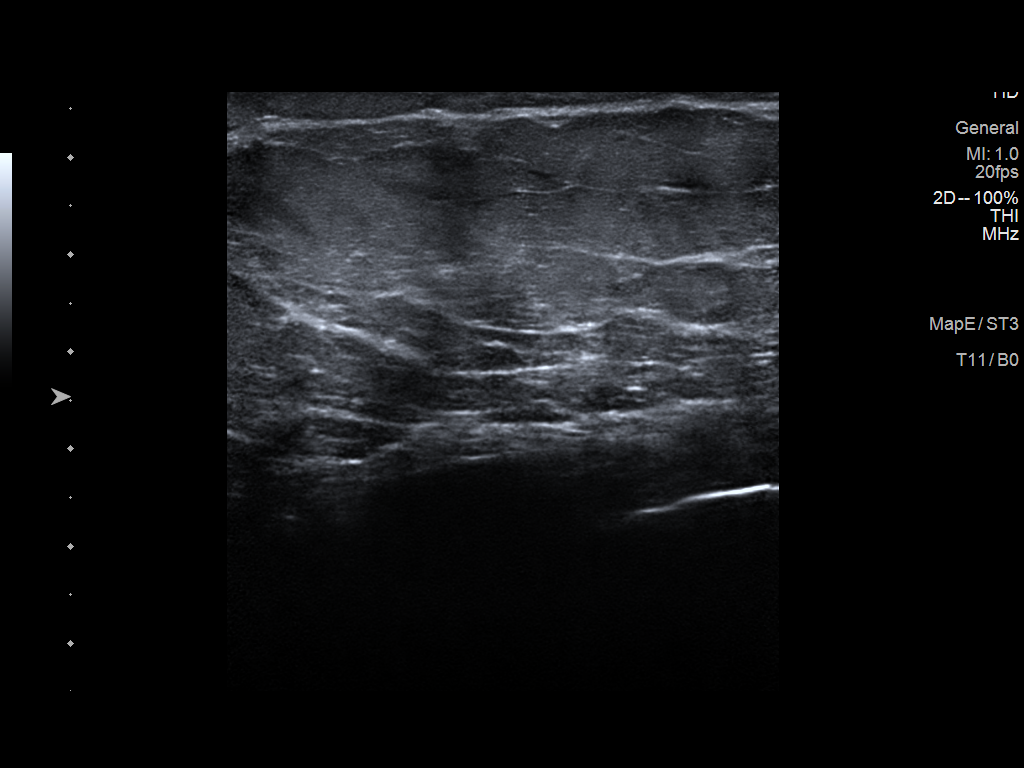
[im 3/6]
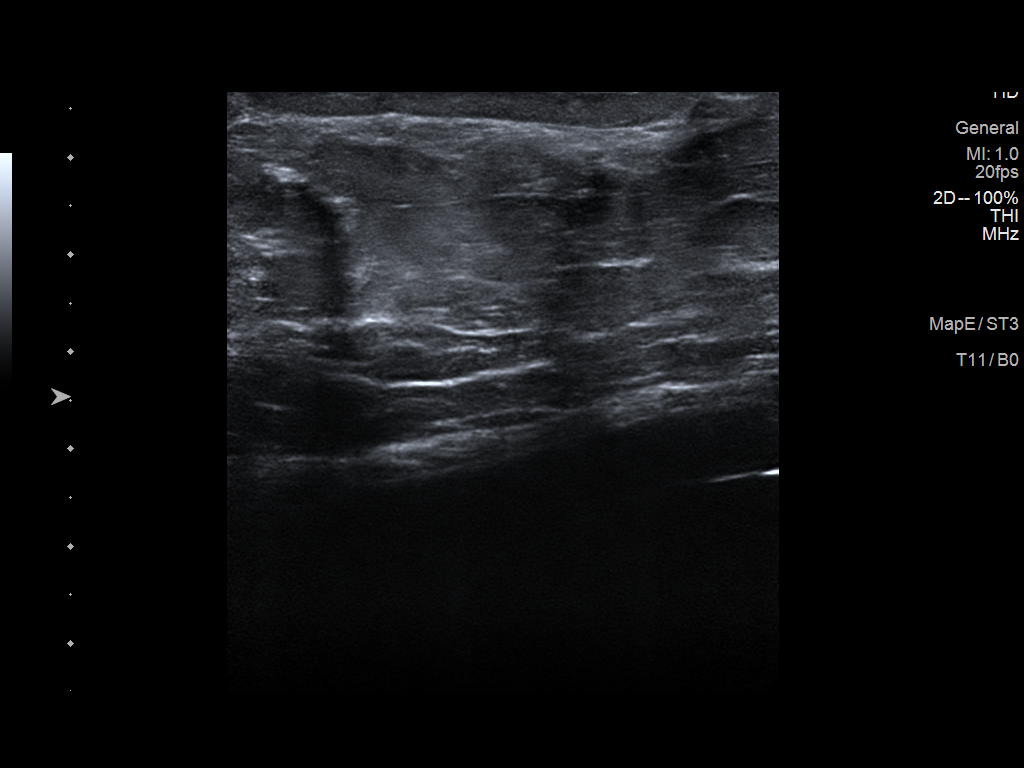
[im 4/6]
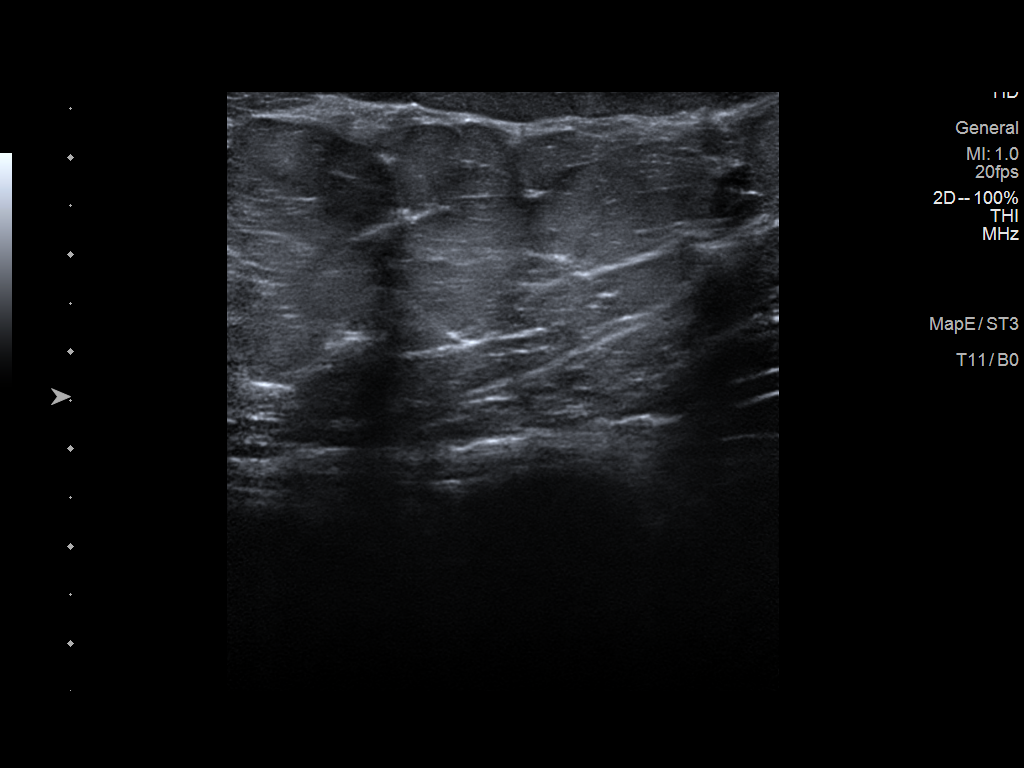
[im 5/6]
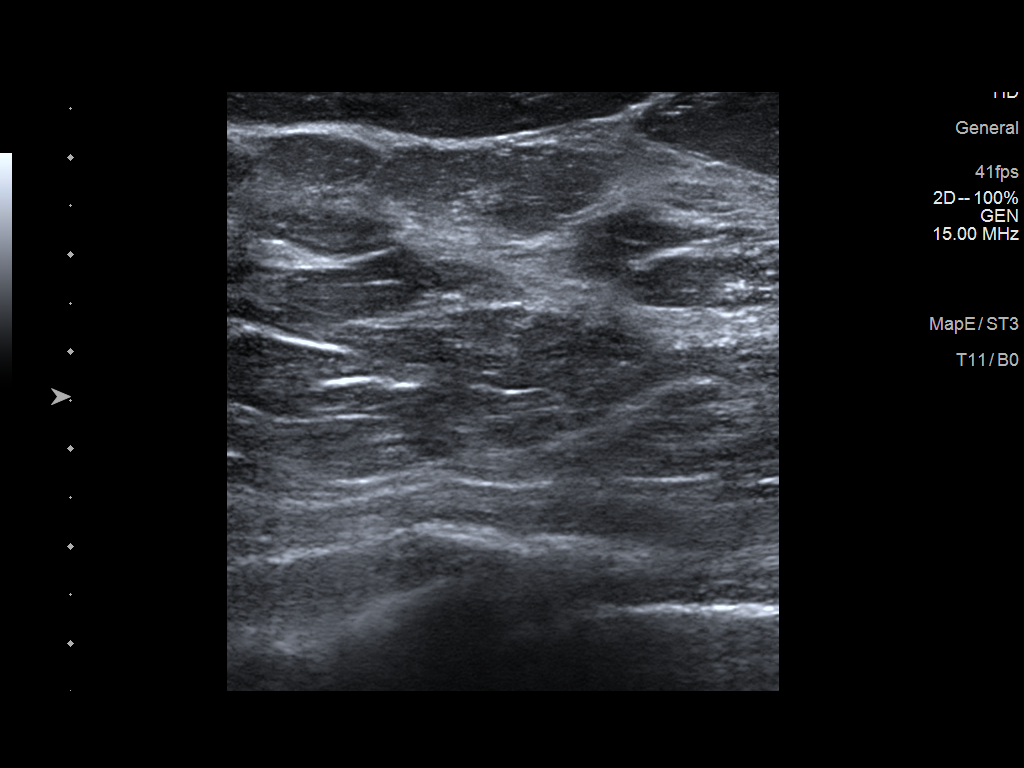
[im 6/6]
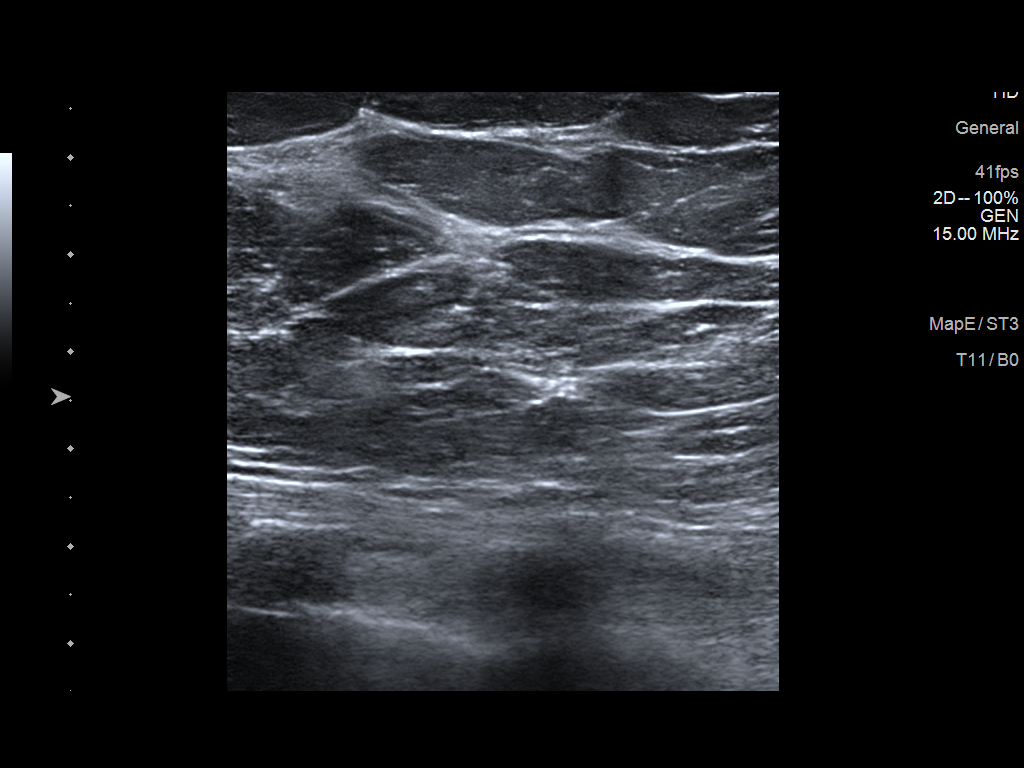

[6 of 6 positions shown; findings below may reference images not displayed]

ACR Breast Density Category b: There are scattered areas of
fibroglandular density.
FINDINGS: The focal asymmetry in the central to superior left breast appears
mammographically stable. No suspicious calcifications, masses or
areas of distortion are seen in the bilateral breasts.

Mammographic images were processed with CAD.

The mass previously identified in the left breast at 6 o'clock, 2 cm
from the nipple is no longer visualized. The area in question, I
believe, corresponds to a normal fat lobule, which on real-time
imaging extends into normal surrounding tissue.

Ultrasound of the left breast at 12 o'clock was performed for
further evaluation of the focal asymmetry in the central to superior
left breast. No abnormal tissue is identified sonographically.
IMPRESSION: 1. The mass identified in the left breast at 6 o'clock on the prior
exam is favored to represent a benign fat lobule.

2.  The focal asymmetry in the superior left breast is stable.

RECOMMENDATION:
Six-month follow-up bilateral diagnostic mammogram is recommended to
ensure stability of the left breast focal asymmetry.

I have discussed the findings and recommendations with the patient.
Results were also provided in writing at the conclusion of the
visit. If applicable, a reminder letter will be sent to the patient
regarding the next appointment.

BI-RADS CATEGORY  3: Probably benign.

## 2018-04-20 ENCOUNTER — Other Ambulatory Visit: Payer: Self-pay | Admitting: Nurse Practitioner
